# Patient Record
Sex: Female | Born: 1940 | Race: Black or African American | Hispanic: No | Smoking: Never smoker
Health system: Southern US, Community
[De-identification: ages and names within clinical notes are randomized; demographics above are authoritative.]

## PROBLEM LIST (undated history)

## (undated) DIAGNOSIS — J302 Other seasonal allergic rhinitis: Secondary | ICD-10-CM

## (undated) DIAGNOSIS — I10 Essential (primary) hypertension: Secondary | ICD-10-CM

## (undated) DIAGNOSIS — K219 Gastro-esophageal reflux disease without esophagitis: Secondary | ICD-10-CM

---

## 2004-07-17 ENCOUNTER — Encounter: Admission: RE | Admit: 2004-07-17 | Discharge: 2004-07-17 | Payer: Self-pay | Admitting: Internal Medicine

## 2005-11-23 ENCOUNTER — Encounter: Admission: RE | Admit: 2005-11-23 | Discharge: 2005-11-23 | Payer: Self-pay | Admitting: Gastroenterology

## 2005-12-05 ENCOUNTER — Emergency Department (HOSPITAL_COMMUNITY): Admission: EM | Admit: 2005-12-05 | Discharge: 2005-12-05 | Payer: Self-pay | Admitting: Emergency Medicine

## 2006-01-12 ENCOUNTER — Encounter: Admission: RE | Admit: 2006-01-12 | Discharge: 2006-01-12 | Payer: Self-pay | Admitting: Internal Medicine

## 2007-01-16 ENCOUNTER — Encounter: Admission: RE | Admit: 2007-01-16 | Discharge: 2007-01-16 | Payer: Self-pay | Admitting: Internal Medicine

## 2007-10-14 ENCOUNTER — Emergency Department (HOSPITAL_COMMUNITY): Admission: EM | Admit: 2007-10-14 | Discharge: 2007-10-14 | Payer: Self-pay | Admitting: Emergency Medicine

## 2008-08-08 ENCOUNTER — Encounter: Admission: RE | Admit: 2008-08-08 | Discharge: 2008-08-08 | Payer: Self-pay | Admitting: Internal Medicine

## 2009-07-13 ENCOUNTER — Observation Stay (HOSPITAL_COMMUNITY): Admission: EM | Admit: 2009-07-13 | Discharge: 2009-07-14 | Payer: Self-pay | Admitting: Emergency Medicine

## 2009-07-14 ENCOUNTER — Encounter (INDEPENDENT_AMBULATORY_CARE_PROVIDER_SITE_OTHER): Payer: Self-pay | Admitting: Internal Medicine

## 2010-03-21 ENCOUNTER — Observation Stay (HOSPITAL_COMMUNITY): Admission: EM | Admit: 2010-03-21 | Discharge: 2010-03-23 | Payer: Self-pay | Admitting: Emergency Medicine

## 2010-07-11 ENCOUNTER — Encounter: Payer: Self-pay | Admitting: Internal Medicine

## 2010-07-12 ENCOUNTER — Encounter: Payer: Self-pay | Admitting: Internal Medicine

## 2010-09-03 LAB — CBC
HCT: 27.6 % — ABNORMAL LOW (ref 36.0–46.0)
HCT: 30.4 % — ABNORMAL LOW (ref 36.0–46.0)
HCT: 31.7 % — ABNORMAL LOW (ref 36.0–46.0)
Hemoglobin: 11.2 g/dL — ABNORMAL LOW (ref 12.0–15.0)
Hemoglobin: 8.7 g/dL — ABNORMAL LOW (ref 12.0–15.0)
Hemoglobin: 9.1 g/dL — ABNORMAL LOW (ref 12.0–15.0)
Hemoglobin: 9.5 g/dL — ABNORMAL LOW (ref 12.0–15.0)
Hemoglobin: 9.9 g/dL — ABNORMAL LOW (ref 12.0–15.0)
MCH: 25.6 pg — ABNORMAL LOW (ref 26.0–34.0)
MCH: 26.3 pg (ref 26.0–34.0)
MCH: 26.3 pg (ref 26.0–34.0)
MCH: 26.4 pg (ref 26.0–34.0)
MCHC: 31.2 g/dL (ref 30.0–36.0)
MCHC: 31.3 g/dL (ref 30.0–36.0)
MCHC: 31.5 g/dL (ref 30.0–36.0)
MCHC: 31.5 g/dL (ref 30.0–36.0)
MCV: 81.9 fL (ref 78.0–100.0)
MCV: 83.4 fL (ref 78.0–100.0)
MCV: 83.5 fL (ref 78.0–100.0)
Platelets: 174 10*3/uL (ref 150–400)
Platelets: 319 10*3/uL (ref 150–400)
Platelets: 345 10*3/uL (ref 150–400)
Platelets: 347 10*3/uL (ref 150–400)
Platelets: 372 10*3/uL (ref 150–400)
RBC: 3.31 MIL/uL — ABNORMAL LOW (ref 3.87–5.11)
RBC: 3.5 MIL/uL — ABNORMAL LOW (ref 3.87–5.11)
RBC: 3.64 MIL/uL — ABNORMAL LOW (ref 3.87–5.11)
RBC: 3.87 MIL/uL (ref 3.87–5.11)
RBC: 4.24 MIL/uL (ref 3.87–5.11)
RDW: 16.2 % — ABNORMAL HIGH (ref 11.5–15.5)
RDW: 16.2 % — ABNORMAL HIGH (ref 11.5–15.5)
RDW: 16.3 % — ABNORMAL HIGH (ref 11.5–15.5)
WBC: 7.3 10*3/uL (ref 4.0–10.5)
WBC: 7.6 10*3/uL (ref 4.0–10.5)
WBC: 8.2 10*3/uL (ref 4.0–10.5)
WBC: 8.9 10*3/uL (ref 4.0–10.5)

## 2010-09-03 LAB — COMPREHENSIVE METABOLIC PANEL
ALT: 10 U/L (ref 0–35)
AST: 15 U/L (ref 0–37)
Albumin: 3.3 g/dL — ABNORMAL LOW (ref 3.5–5.2)
Alkaline Phosphatase: 72 U/L (ref 39–117)
CO2: 25 mEq/L (ref 19–32)
Calcium: 9.3 mg/dL (ref 8.4–10.5)
Chloride: 112 mEq/L (ref 96–112)
Creatinine, Ser: 0.86 mg/dL (ref 0.4–1.2)
GFR calc Af Amer: >60 mL/min (ref >60)
GFR calc non Af Amer: >60 mL/min (ref >60)
Glucose, Bld: 108 mg/dL — ABNORMAL HIGH (ref 70–99)
Potassium: 3.9 mEq/L (ref 3.5–5.1)
Sodium: 143 mEq/L (ref 135–145)
Total Protein: 6.8 g/dL (ref 6.0–8.3)

## 2010-09-03 LAB — URINALYSIS, ROUTINE W REFLEX MICROSCOPIC
Bilirubin Urine: NEGATIVE
Glucose, UA: NEGATIVE mg/dL
Hgb urine dipstick: NEGATIVE
Ketones, ur: NEGATIVE mg/dL
Nitrite: NEGATIVE
Protein, ur: NEGATIVE mg/dL
Specific Gravity, Urine: 1.022 (ref 1.005–1.030)
Urobilinogen, UA: 0.2 mg/dL (ref 0.0–1.0)
pH: 5.5 (ref 5.0–8.0)

## 2010-09-03 LAB — BASIC METABOLIC PANEL
BUN: 6 mg/dL (ref 6–23)
CO2: 25 mEq/L (ref 19–32)
CO2: 26 mEq/L (ref 19–32)
Calcium: 8.8 mg/dL (ref 8.4–10.5)
Calcium: 8.9 mg/dL (ref 8.4–10.5)
Chloride: 110 mEq/L (ref 96–112)
Creatinine, Ser: 0.81 mg/dL (ref 0.4–1.2)
Creatinine, Ser: 0.88 mg/dL (ref 0.4–1.2)
GFR calc Af Amer: >60 mL/min (ref >60)
GFR calc Af Amer: >60 mL/min (ref >60)
GFR calc non Af Amer: >60 mL/min (ref >60)
Glucose, Bld: 96 mg/dL (ref 70–99)
Potassium: 3.5 mEq/L (ref 3.5–5.1)
Sodium: 141 mEq/L (ref 135–145)
Sodium: 142 mEq/L (ref 135–145)

## 2010-09-03 LAB — IRON AND TIBC
Iron: 41 ug/dL — ABNORMAL LOW (ref 42–135)
UIBC: <55 ug/dL

## 2010-09-03 LAB — FOLATE: Folate: 16 ng/mL

## 2010-09-03 LAB — RETICULOCYTES
RBC.: 3.75 MIL/uL — ABNORMAL LOW (ref 3.87–5.11)
Retic Count, Absolute: 52.5 10*3/uL (ref 19.0–186.0)
Retic Ct Pct: 1.4 % (ref 0.4–3.1)

## 2010-09-03 LAB — URINE CULTURE: Culture  Setup Time: 201110011107

## 2010-09-03 LAB — DIFFERENTIAL
Basophils Absolute: 0 10*3/uL (ref 0.0–0.1)
Basophils Relative: 0 % (ref 0–1)
Eosinophils Absolute: 0.2 10*3/uL (ref 0.0–0.7)
Eosinophils Relative: 3 % (ref 0–5)
Lymphocytes Relative: 34 % (ref 12–46)
Monocytes Absolute: 0.4 10*3/uL (ref 0.1–1.0)
Monocytes Relative: 4 % (ref 3–12)
Neutrophils Relative %: 59 % (ref 43–77)

## 2010-09-03 LAB — PREPARE RBC (CROSSMATCH)

## 2010-09-03 LAB — URINE MICROSCOPIC-ADD ON

## 2010-09-03 LAB — HEMOCCULT GUIAC POC 1CARD (OFFICE): Fecal Occult Bld: POSITIVE

## 2010-09-03 LAB — TYPE AND SCREEN
ABO/RH(D): O POS
Antibody Screen: NEGATIVE

## 2010-09-03 LAB — CEA: CEA: <0.5 ng/mL (ref 0.0–5.0)

## 2010-09-03 LAB — PROTIME-INR
INR: 0.95 (ref 0.00–1.49)
Prothrombin Time: 12.9 seconds (ref 11.6–15.2)

## 2010-09-07 LAB — CARDIAC PANEL(CRET KIN+CKTOT+MB+TROPI)
CK, MB: 0.6 ng/mL (ref 0.3–4.0)
CK, MB: 0.7 ng/mL (ref 0.3–4.0)
Relative Index: INVALID (ref 0.0–2.5)
Troponin I: 0.02 ng/mL (ref 0.00–0.06)
Troponin I: 0.04 ng/mL (ref 0.00–0.06)

## 2010-09-07 LAB — LIPID PANEL
HDL: 45 mg/dL (ref >39)
LDL Cholesterol: 91 mg/dL (ref 0–99)
Triglycerides: 77 mg/dL (ref <150)
VLDL: 15 mg/dL (ref 0–40)

## 2010-09-07 LAB — DIFFERENTIAL
Eosinophils Relative: 4 % (ref 0–5)
Lymphocytes Relative: 43 % (ref 12–46)
Lymphs Abs: 2.6 10*3/uL (ref 0.7–4.0)
Monocytes Relative: 5 % (ref 3–12)

## 2010-09-07 LAB — POCT I-STAT, CHEM 8
BUN: 13 mg/dL (ref 6–23)
Chloride: 112 mEq/L (ref 96–112)
Glucose, Bld: 85 mg/dL (ref 70–99)
HCT: 38 % (ref 36.0–46.0)
Potassium: 3.7 mEq/L (ref 3.5–5.1)

## 2010-09-07 LAB — BASIC METABOLIC PANEL
Chloride: 104 mEq/L (ref 96–112)
Creatinine, Ser: 0.83 mg/dL (ref 0.4–1.2)
GFR calc Af Amer: >60 mL/min (ref >60)
Potassium: 3.4 mEq/L — ABNORMAL LOW (ref 3.5–5.1)
Sodium: 136 mEq/L (ref 135–145)

## 2010-09-07 LAB — CBC
HCT: 36.6 % (ref 36.0–46.0)
HCT: 37 % (ref 36.0–46.0)
Hemoglobin: 12.1 g/dL (ref 12.0–15.0)
Hemoglobin: 12.3 g/dL (ref 12.0–15.0)
MCV: 83.8 fL (ref 78.0–100.0)
Platelets: 249 10*3/uL (ref 150–400)
Platelets: 262 10*3/uL (ref 150–400)
RBC: 4.37 MIL/uL (ref 3.87–5.11)
WBC: 6.1 10*3/uL (ref 4.0–10.5)
WBC: 6.2 10*3/uL (ref 4.0–10.5)

## 2010-09-07 LAB — POCT CARDIAC MARKERS
CKMB, poc: <1 ng/mL — ABNORMAL LOW (ref 1.0–8.0)
Myoglobin, poc: 53.2 ng/mL (ref 12–200)
Troponin i, poc: <0.05 ng/mL (ref 0.00–0.09)

## 2010-09-07 LAB — URINALYSIS, ROUTINE W REFLEX MICROSCOPIC
Glucose, UA: NEGATIVE mg/dL
Nitrite: NEGATIVE
Specific Gravity, Urine: 1.013 (ref 1.005–1.030)
pH: 7 (ref 5.0–8.0)

## 2010-09-07 LAB — URINE CULTURE: Colony Count: NO GROWTH

## 2010-09-07 LAB — PROTIME-INR: Prothrombin Time: 12.4 seconds (ref 11.6–15.2)

## 2014-11-10 ENCOUNTER — Encounter (HOSPITAL_COMMUNITY): Payer: Self-pay | Admitting: *Deleted

## 2014-11-10 ENCOUNTER — Emergency Department (HOSPITAL_COMMUNITY): Payer: Medicare Other

## 2014-11-10 ENCOUNTER — Emergency Department (HOSPITAL_COMMUNITY)
Admission: EM | Admit: 2014-11-10 | Discharge: 2014-11-10 | Disposition: A | Discharge status: Home / Self Care | Payer: Medicare Other | Attending: Emergency Medicine | Admitting: Emergency Medicine

## 2014-11-10 DIAGNOSIS — I10 Essential (primary) hypertension: Secondary | ICD-10-CM | POA: Insufficient documentation

## 2014-11-10 DIAGNOSIS — R079 Chest pain, unspecified: Secondary | ICD-10-CM | POA: Insufficient documentation

## 2014-11-10 DIAGNOSIS — J441 Chronic obstructive pulmonary disease with (acute) exacerbation: Secondary | ICD-10-CM | POA: Diagnosis not present

## 2014-11-10 DIAGNOSIS — J209 Acute bronchitis, unspecified: Secondary | ICD-10-CM

## 2014-11-10 DIAGNOSIS — R05 Cough: Secondary | ICD-10-CM | POA: Diagnosis present

## 2014-11-10 DIAGNOSIS — R509 Fever, unspecified: Secondary | ICD-10-CM | POA: Insufficient documentation

## 2014-11-10 DIAGNOSIS — R059 Cough, unspecified: Secondary | ICD-10-CM

## 2014-11-10 HISTORY — DX: Essential (primary) hypertension: I10

## 2014-11-10 HISTORY — DX: Other seasonal allergic rhinitis: J30.2

## 2014-11-10 LAB — BASIC METABOLIC PANEL
ANION GAP: 8 (ref 5–15)
BUN: 12 mg/dL (ref 6–20)
CALCIUM: 9.3 mg/dL (ref 8.9–10.3)
CO2: 26 mmol/L (ref 22–32)
CREATININE: 0.95 mg/dL (ref 0.44–1.00)
Chloride: 106 mmol/L (ref 101–111)
GFR, EST NON AFRICAN AMERICAN: 57 mL/min — AB (ref >60)
GLUCOSE: 135 mg/dL — AB (ref 65–99)
Potassium: 3.5 mmol/L (ref 3.5–5.1)
SODIUM: 140 mmol/L (ref 135–145)

## 2014-11-10 LAB — CBC
HCT: 36.2 % (ref 36.0–46.0)
Hemoglobin: 11.4 g/dL — ABNORMAL LOW (ref 12.0–15.0)
MCH: 26.6 pg (ref 26.0–34.0)
MCHC: 31.5 g/dL (ref 30.0–36.0)
MCV: 84.4 fL (ref 78.0–100.0)
PLATELETS: 285 10*3/uL (ref 150–400)
RBC: 4.29 MIL/uL (ref 3.87–5.11)
RDW: 16 % — ABNORMAL HIGH (ref 11.5–15.5)
WBC: 8.4 10*3/uL (ref 4.0–10.5)

## 2014-11-10 LAB — I-STAT TROPONIN, ED: TROPONIN I, POC: 0 ng/mL (ref 0.00–0.08)

## 2014-11-10 LAB — BRAIN NATRIURETIC PEPTIDE: B Natriuretic Peptide: 11.8 pg/mL (ref 0.0–100.0)

## 2014-11-10 MED ORDER — IPRATROPIUM-ALBUTEROL 0.5-2.5 (3) MG/3ML IN SOLN
3.0000 mL | Freq: Once | RESPIRATORY_TRACT | Status: AC
  Administered 2014-11-10: 3 mL via RESPIRATORY_TRACT
  Filled 2014-11-10: qty 3

## 2014-11-10 MED ORDER — GUAIFENESIN-DM 100-10 MG/5ML PO SYRP: 5.0000 mL | ORAL_SOLUTION | ORAL | Status: DC | PRN

## 2014-11-10 MED ORDER — PREDNISONE 20 MG PO TABS
60.0000 mg | ORAL_TABLET | Freq: Once | ORAL | Status: AC
  Administered 2014-11-10: 60 mg via ORAL
  Filled 2014-11-10: qty 3

## 2014-11-10 MED ORDER — ALBUTEROL SULFATE HFA 108 (90 BASE) MCG/ACT IN AERS: 1.0000 | INHALATION_SPRAY | Freq: Four times a day (QID) | RESPIRATORY_TRACT | Status: AC | PRN

## 2014-11-10 MED ORDER — GUAIFENESIN ER 600 MG PO TB12: 1200.0000 mg | ORAL_TABLET | Freq: Two times a day (BID) | ORAL | Status: DC

## 2014-11-10 MED ORDER — BENZONATATE 200 MG PO CAPS: 200.0000 mg | ORAL_CAPSULE | Freq: Three times a day (TID) | ORAL | Status: DC | PRN

## 2014-11-10 MED ORDER — PREDNISONE 20 MG PO TABS: 60.0000 mg | ORAL_TABLET | Freq: Every day | ORAL | Status: DC

## 2014-11-10 MED ORDER — BENZONATATE 100 MG PO CAPS
200.0000 mg | ORAL_CAPSULE | Freq: Three times a day (TID) | ORAL | Status: DC | PRN
  Administered 2014-11-10: 200 mg via ORAL
  Filled 2014-11-10: qty 2

## 2014-11-10 NOTE — ED Provider Notes (Signed)
CSN: [id number]     Arrival date & time 11/10/14  0153 History  This chart was scribed for Carol Severinlga Amalia Edgecombe, MD by Abel PrestoKara Demonbreun, ED Scribe. This patient was seen in room B16C/B16C and the patient's care was started at 2:56 AM.    Chief Complaint  Patient presents with  . Cough    The history is provided by the patient. No language interpreter was used.   HPI Comments: Carol HartshornLela M Carlson is a 74 y.o. female with PMHx of HTN who presents to the Emergency Department complaining of recurrent productive cough and congestion with onset 3 months ago, worsening tonight. Pt notes associated subjective fever, chills, and right rib pain with cough. Pt with h/o COPD and asthma. Pt used an inhaler for relief. Pt took Coricidin for relief. Pt has not been seen by a physician since onset. She denies any other complaints at this time.     Past Medical History  Diagnosis Date  . Hypertension   . Seasonal allergies    History reviewed. No pertinent past surgical history. History reviewed. No pertinent family history. History  Substance Use Topics  . Smoking status: Never Smoker   . Smokeless tobacco: Not on file  . Alcohol Use: Not on file   OB History    No data available     Review of Systems  Constitutional: Positive for fever (subjective) and chills.  HENT: Positive for congestion. Negative for sore throat.   Respiratory: Positive for cough.   Musculoskeletal: Positive for myalgias.      Allergies  Review of patient's allergies indicates no known allergies.  Home Medications   Prior to Admission medications   Not on File   BP 138/71 mmHg  Pulse 92  Temp(Src) 98.9 F (37.2 C) (Oral)  Resp 20  Wt 180 lb (81.647 kg)  SpO2 100% Physical Exam  Constitutional: She is oriented to person, place, and time. She appears well-developed and well-nourished.  HENT:  Head: Normocephalic and atraumatic.  Nose: Nose normal.  Mouth/Throat: Oropharynx is clear and moist.  Eyes: Conjunctivae and EOM  are normal. Pupils are equal, round, and reactive to light.  Neck: Normal range of motion. Neck supple. No JVD present. No tracheal deviation present. No thyromegaly present.  Cardiovascular: Normal rate, regular rhythm, normal heart sounds and intact distal pulses.  Exam reveals no gallop and no friction rub.   No murmur heard. Pulmonary/Chest: Effort normal. No stridor. No respiratory distress. She has wheezes. She has no rales. She exhibits no tenderness.  Abdominal: Soft. Bowel sounds are normal. She exhibits no distension and no mass. There is no tenderness. There is no rebound and no guarding.  Musculoskeletal: Normal range of motion. She exhibits no edema or tenderness.  Lymphadenopathy:    She has no cervical adenopathy.  Neurological: She is alert and oriented to person, place, and time. She displays normal reflexes. She exhibits normal muscle tone. Coordination normal.  Skin: Skin is warm and dry. No rash noted. No erythema. No pallor.  Psychiatric: She has a normal mood and affect. Her behavior is normal. Judgment and thought content normal.  Nursing note and vitals reviewed.   ED Course  Procedures (including critical care time) DIAGNOSTIC STUDIES: Oxygen Saturation is 100% on room air, normal by my interpretation.    COORDINATION OF CARE: 3:02 AM Discussed treatment plan with patient at beside, the patient agrees with the plan and has no further questions at this time.   Labs Review Labs Reviewed  CBC -  Abnormal; Notable for the following:    Hemoglobin 11.4 (*)    RDW 16.0 (*)    All other components within normal limits  BASIC METABOLIC PANEL - Abnormal; Notable for the following:    Glucose, Bld 135 (*)    GFR calc non Af Amer 57 (*)    All other components within normal limits  BRAIN NATRIURETIC PEPTIDE  I-STAT TROPOININ, ED    Imaging Review No results found.   EKG Interpretation   Date/Time:  Sunday Nov 10 2014 02:15:56 EDT Ventricular Rate:  87 PR  Interval:  166 QRS Duration: 90 QT Interval:  378 QTC Calculation: 454 R Axis:   -13 Text Interpretation:  Normal sinus rhythm Normal ECG Confirmed by WARD,   DO, KRISTEN ([id number]) on 11/10/2014 2:28:24 AM      MDM   Final diagnoses:  Bronchospasm with bronchitis, acute  Cough    I personally performed the services described in this documentation, which was scribed in my presence. The recorded information has been reviewed and is accurate.  74 year old female, history of COPD who presents with intermittent persistent cough for the last 3 months.  She has not seen her doctor recently.  Has had subjective fevers and chills but not recently.  No improvement with over-the-counter medications taken.  She has been using her inhaler without improvement.  No chest pain.  Tonight, worsening shortness of breath which brought her to the ER    Carol Severin, MD 11/10/14 [phone]

## 2014-11-10 NOTE — Discharge Instructions (Signed)
Cough, Adult  A cough is a reflex that helps clear your throat and airways. It can help heal the body or may be a reaction to an irritated airway. A cough may only last 2 or 3 weeks (acute) or may last more than 8 weeks (chronic).  CAUSES Acute cough:  Viral or bacterial infections. Chronic cough:  Infections.  Allergies.  Asthma.  Post-nasal drip.  Smoking.  Heartburn or acid reflux.  Some medicines.  Chronic lung problems (COPD).  Cancer. SYMPTOMS   Cough.  Fever.  Chest pain.  Increased breathing rate.  High-pitched whistling sound when breathing (wheezing).  Colored mucus that you cough up (sputum). TREATMENT   A bacterial cough may be treated with antibiotic medicine.  A viral cough must run its course and will not respond to antibiotics.  Your caregiver may recommend other treatments if you have a chronic cough. HOME CARE INSTRUCTIONS   Only take over-the-counter or prescription medicines for pain, discomfort, or fever as directed by your caregiver. Use cough suppressants only as directed by your caregiver.  Use a cold steam vaporizer or humidifier in your bedroom or home to help loosen secretions.  Sleep in a semi-upright position if your cough is worse at night.  Rest as needed.  Stop smoking if you smoke. SEEK IMMEDIATE MEDICAL CARE IF:   You have pus in your sputum.  Your cough starts to worsen.  You cannot control your cough with suppressants and are losing sleep.  You begin coughing up blood.  You have difficulty breathing.  You develop pain which is getting worse or is uncontrolled with medicine.  You have a fever. MAKE SURE YOU:   Understand these instructions.  Will watch your condition.  Will get help right away if you are not doing well or get worse. Document Released: 12/04/2010 Document Revised: 08/30/2011 Document Reviewed: 12/04/2010 [facility] Patient Information 2015 [place], [place]. This information is not intended  to replace advice given to you by your health care provider. Make sure you discuss any questions you have with your health care provider.  Cool Mist Vaporizers Vaporizers may help relieve the symptoms of a cough and cold. They add moisture to the air, which helps mucus to become thinner and less sticky. This makes it easier to breathe and cough up secretions. Cool mist vaporizers do not cause serious burns like hot mist vaporizers, which may also be called steamers or humidifiers. Vaporizers have not been proven to help with colds. You should not use a vaporizer if you are allergic to mold. HOME CARE INSTRUCTIONS  Follow the package instructions for the vaporizer.  Do not use anything other than distilled water in the vaporizer.  Do not run the vaporizer all of the time. This can cause mold or bacteria to grow in the vaporizer.  Clean the vaporizer after each time it is used.  Clean and dry the vaporizer well before storing it.  Stop using the vaporizer if worsening respiratory symptoms develop. Document Released: 03/04/2004 Document Revised: 06/12/2013 Document Reviewed: 10/25/2012 [facility] Patient Information 2015 [place], [place]. This information is not intended to replace advice given to you by your health care provider. Make sure you discuss any questions you have with your health care provider.  Acute Bronchitis Bronchitis is inflammation of the airways that extend from the windpipe into the lungs (bronchi). The inflammation often causes mucus to develop. This leads to a cough, which is the most common symptom of bronchitis.  In acute bronchitis, the condition usually develops  suddenly and goes away over time, usually in a couple weeks. Smoking, allergies, and asthma can make bronchitis worse. Repeated episodes of bronchitis may cause further lung problems.  CAUSES Acute bronchitis is most often caused by the same virus that causes a cold. The virus can spread from person to person  (contagious) through coughing, sneezing, and touching contaminated objects. SIGNS AND SYMPTOMS   Cough.   Fever.   Coughing up mucus.   Body aches.   Chest congestion.   Chills.   Shortness of breath.   Sore throat.  DIAGNOSIS  Acute bronchitis is usually diagnosed through a physical exam. Your health care provider will also ask you questions about your medical history. Tests, such as chest X-rays, are sometimes done to rule out other conditions.  TREATMENT  Acute bronchitis usually goes away in a couple weeks. Oftentimes, no medical treatment is necessary. Medicines are sometimes given for relief of fever or cough. Antibiotic medicines are usually not needed but may be prescribed in certain situations. In some cases, an inhaler may be recommended to help reduce shortness of breath and control the cough. A cool mist vaporizer may also be used to help thin bronchial secretions and make it easier to clear the chest.  HOME CARE INSTRUCTIONS  Get plenty of rest.   Drink enough fluids to keep your urine clear or pale yellow (unless you have a medical condition that requires fluid restriction). Increasing fluids may help thin your respiratory secretions (sputum) and reduce chest congestion, and it will prevent dehydration.   Take medicines only as directed by your health care provider.  If you were prescribed an antibiotic medicine, finish it all even if you start to feel better.  Avoid smoking and secondhand smoke. Exposure to cigarette smoke or irritating chemicals will make bronchitis worse. If you are a smoker, consider using nicotine gum or skin patches to help control withdrawal symptoms. Quitting smoking will help your lungs heal faster.   Reduce the chances of another bout of acute bronchitis by washing your hands frequently, avoiding people with cold symptoms, and trying not to touch your hands to your mouth, nose, or eyes.   Keep all follow-up visits as directed by  your health care provider.  SEEK MEDICAL CARE IF: Your symptoms do not improve after 1 week of treatment.  SEEK IMMEDIATE MEDICAL CARE IF:  You develop an increased fever or chills.   You have chest pain.   You have severe shortness of breath.  You have bloody sputum.   You develop dehydration.  You faint or repeatedly feel like you are going to pass out.  You develop repeated vomiting.  You develop a severe headache. MAKE SURE YOU:   Understand these instructions.  Will watch your condition.  Will get help right away if you are not doing well or get worse. Document Released: 07/15/2004 Document Revised: 10/22/2013 Document Reviewed: 11/28/2012 [facility] Patient Information 2015 [place], [place]. This information is not intended to replace advice given to you by your health care provider. Make sure you discuss any questions you have with your health care provider.

## 2014-11-10 NOTE — ED Notes (Signed)
Pt in c/o cough and congestion for the last few months, states the cough will get better for a few weeks then get worse again, in tonight because symptoms were getting worse ago, reports chills, unsure of fever, cough is productive with while sputum, no distress noted

## 2014-12-18 ENCOUNTER — Emergency Department (HOSPITAL_COMMUNITY)
Admission: EM | Admit: 2014-12-18 | Discharge: 2014-12-19 | Disposition: A | Discharge status: Home / Self Care | Payer: Medicare Other | Attending: Emergency Medicine | Admitting: Emergency Medicine

## 2014-12-18 ENCOUNTER — Encounter (HOSPITAL_COMMUNITY): Payer: Self-pay | Admitting: *Deleted

## 2014-12-18 DIAGNOSIS — Y998 Other external cause status: Secondary | ICD-10-CM | POA: Diagnosis not present

## 2014-12-18 DIAGNOSIS — X58XXXA Exposure to other specified factors, initial encounter: Secondary | ICD-10-CM | POA: Diagnosis not present

## 2014-12-18 DIAGNOSIS — I10 Essential (primary) hypertension: Secondary | ICD-10-CM | POA: Insufficient documentation

## 2014-12-18 DIAGNOSIS — Y9389 Activity, other specified: Secondary | ICD-10-CM | POA: Diagnosis not present

## 2014-12-18 DIAGNOSIS — Z79899 Other long term (current) drug therapy: Secondary | ICD-10-CM | POA: Insufficient documentation

## 2014-12-18 DIAGNOSIS — S29012A Strain of muscle and tendon of back wall of thorax, initial encounter: Secondary | ICD-10-CM

## 2014-12-18 DIAGNOSIS — Z7952 Long term (current) use of systemic steroids: Secondary | ICD-10-CM | POA: Insufficient documentation

## 2014-12-18 DIAGNOSIS — S299XXA Unspecified injury of thorax, initial encounter: Secondary | ICD-10-CM | POA: Diagnosis present

## 2014-12-18 DIAGNOSIS — Y9289 Other specified places as the place of occurrence of the external cause: Secondary | ICD-10-CM | POA: Insufficient documentation

## 2014-12-18 MED ORDER — TRAMADOL HCL 50 MG PO TABS
50.0000 mg | ORAL_TABLET | Freq: Once | ORAL | Status: AC
  Administered 2014-12-19: 50 mg via ORAL
  Filled 2014-12-18: qty 1

## 2014-12-18 NOTE — ED Notes (Signed)
Wentz, MD at bedside.  

## 2014-12-18 NOTE — ED Notes (Signed)
The pt is c/o neck pain and bi-lateral shoulder pain since this am. With some thoracic spine pain since this am. No n v or diarrhea

## 2014-12-18 NOTE — ED Provider Notes (Signed)
CSN: [id number]     Arrival date & time 12/18/14  1926 History   First MD Initiated Contact with Patient 12/18/14 2305     Chief Complaint  Patient presents with  . Back Pain     (Consider location/radiation/quality/duration/timing/severity/associated sxs/prior Treatment) HPI   Carol Carlson is a 74 y.o. female percents for evaluation of upper back pain starting this morning, after lifting heavy boxes 2 days ago. [facility] without relief. Pain radiates to her neck centrally posterior. No radicular symptoms in the arms. Carol Carlson is able to walk without problems. No other known trauma. No preceding or prior symptoms. There are no other known modifying factors.   Past Medical History  Diagnosis Date  . Hypertension   . Seasonal allergies    History reviewed. No pertinent past surgical history. No family history on file. History  Substance Use Topics  . Smoking status: Never Smoker   . Smokeless tobacco: Not on file  . Alcohol Use: No   OB History    No data available     Review of Systems  All other systems reviewed and are negative.     Allergies  Review of patient's allergies indicates no known allergies.  Home Medications   Prior to Admission medications   Medication Sig Start Date End Date Taking? Authorizing Provider  albuterol (PROVENTIL HFA;VENTOLIN HFA) 108 (90 BASE) MCG/ACT inhaler Inhale 1-2 puffs into the lungs every 6 (six) hours as needed for wheezing or shortness of breath. 11/10/14   Marisa Severinlga Otter, MD  benzonatate (TESSALON) 200 MG capsule Take 1 capsule (200 mg total) by mouth 3 (three) times daily as needed for cough. 11/10/14   Marisa Severinlga Otter, MD  guaiFENesin (MUCINEX) 600 MG 12 hr tablet Take 2 tablets (1,200 mg total) by mouth 2 (two) times daily. 11/10/14   Marisa Severinlga Otter, MD  guaiFENesin-dextromethorphan (ROBITUSSIN DM) 100-10 MG/5ML syrup Take 5 mLs by mouth every 4 (four) hours as needed for cough. 11/10/14   Marisa Severinlga Otter, MD  predniSONE (DELTASONE) 20 MG  tablet Take 3 tablets (60 mg total) by mouth daily. 11/10/14   Marisa Severinlga Otter, MD   BP 165/67 mmHg  Pulse 70  Temp(Src) 98.2 F (36.8 C)  Resp 20  Ht 5' 3.5" (1.613 m)  Wt 174 lb 2 oz (78.983 kg)  BMI 30.36 kg/m2  SpO2 95% Physical Exam  Constitutional: Carol Carlson is oriented to person, place, and time. Carol Carlson appears well-developed and well-nourished. No distress.  HENT:  Head: Normocephalic and atraumatic.  Right Ear: External ear normal.  Left Ear: External ear normal.  Eyes: Conjunctivae and EOM are normal. Pupils are equal, round, and reactive to light.  Neck: Normal range of motion and phonation normal. Neck supple.  Cardiovascular: Normal rate, regular rhythm and normal heart sounds.   Pulmonary/Chest: Effort normal and breath sounds normal. No respiratory distress. Carol Carlson has no wheezes. Carol Carlson exhibits no tenderness and no bony tenderness.  Musculoskeletal: Normal range of motion.  Tender bilateral trapezius muscles, and bilateral paravertebral cervical musculature. Normal range of motion, neck and upper back.  Neurological: Carol Carlson is alert and oriented to person, place, and time. No cranial nerve deficit or sensory deficit. Carol Carlson exhibits normal muscle tone. Coordination normal.  Skin: Skin is warm, dry and intact.  Psychiatric: Carol Carlson has a normal mood and affect. Her behavior is normal. Judgment and thought content normal.  Nursing note and vitals reviewed.   ED Course  Procedures (including critical care time)  Medications  traMADol (ULTRAM)  tablet 50 mg (not administered)    Patient Vitals for the past 24 hrs:  BP Temp Pulse Resp SpO2 Height Weight  12/18/14 2330 147/71 mmHg - 71 - 99 % - -  12/18/14 2300 136/78 mmHg - 73 - 95 % - -  12/18/14 2232 165/67 mmHg - 70 20 95 % - -  12/18/14 2230 165/67 mmHg - 71 - 97 % - -  12/18/14 2200 154/63 mmHg - 69 - 98 % - -  12/18/14 2145 166/65 mmHg - 74 - 95 % - -  12/18/14 2140 167/70 mmHg - 74 20 97 % - -  12/18/14 1933 163/69 mmHg 98.2 F (36.8  C) 77 (!) 8 96 % 5' 3.5" (1.613 m) 174 lb 2 oz (78.983 kg)    Findings discussed with patient, all questions answered   Labs Review Labs Reviewed - No data to display  Imaging Review No results found.   EKG Interpretation None      MDM   Final diagnoses:  Muscle strain of left upper back, initial encounter  Muscle strain of right upper back, initial encounter    Evaluation is consistent with musculoskeletal strain of upper back. 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 No radicular symptoms or evidence for spinal myelopathy.   Nursing Notes Reviewed/ Care Coordinated Applicable Imaging Reviewed Interpretation of Laboratory Data incorporated into ED treatment  The patient appears reasonably screened and/or stabilized for discharge and I doubt any other medical condition or other [facility] requiring further screening, evaluation, or treatment in the ED at this time prior to discharge.  Plan: Home Medications- Tylenol or tramadol; Home Treatments- rest, heat; return here if the recommended treatment, does not improve the symptoms; Recommended follow up- PCP checkup in 5-7 days.     Mancel Bale, MD 12/19/14 [phone]

## 2014-12-18 NOTE — ED Notes (Signed)
The pt reports that she has not fallen but she has been lifting plastic containers in her room that are filled with clothes and sheets.  Her pain is worse with movement.

## 2014-12-19 DIAGNOSIS — S29012A Strain of muscle and tendon of back wall of thorax, initial encounter: Secondary | ICD-10-CM | POA: Diagnosis not present

## 2014-12-19 MED ORDER — TRAMADOL-ACETAMINOPHEN 37.5-325 MG PO TABS: 1.0000 | ORAL_TABLET | Freq: Four times a day (QID) | ORAL | Status: DC | PRN

## 2014-12-19 NOTE — Discharge Instructions (Signed)
Back Pain, Adult Low back pain is very common. About 1 in 5 people have back pain.The cause of low back pain is rarely dangerous. The pain often gets better over time.About half of people with a sudden onset of back pain feel better in just 2 weeks. About 8 in 10 people feel better by 6 weeks.  CAUSES Some common causes of back pain include:  Strain of the muscles or ligaments supporting the spine.  Wear and tear (degeneration) of the spinal discs.  Arthritis.  Direct injury to the back. DIAGNOSIS Most of the time, the direct cause of low back pain is not known.However, back pain can be treated effectively even when the exact cause of the pain is unknown.Answering your caregiver's questions about your overall health and symptoms is one of the most accurate ways to make sure the cause of your pain is not dangerous. If your caregiver needs more information, he or she may order lab work or imaging tests (X-rays or MRIs).However, even if imaging tests show changes in your back, this usually does not require surgery. HOME CARE INSTRUCTIONS For many people, back pain returns.Since low back pain is rarely dangerous, it is often a condition that people can learn to manageon their own.   Remain active. It is stressful on the back to sit or stand in one place. Do not sit, drive, or stand in one place for more than 30 minutes at a time. Take short walks on level surfaces as soon as pain allows.Try to increase the length of time you walk each day.  Do not stay in bed.Resting more than 1 or 2 days can delay your recovery.  Do not avoid exercise or work.Your body is made to move.It is not dangerous to be active, even though your back may hurt.Your back will likely heal faster if you return to being active before your pain is gone.  Pay attention to your body when you bend and lift. Many people have less discomfortwhen lifting if they bend their knees, keep the load close to their bodies,and  avoid twisting. Often, the most comfortable positions are those that put less stress on your recovering back.  Find a comfortable position to sleep. Use a firm mattress and lie on your side with your knees slightly bent. If you lie on your back, put a pillow under your knees.  Only take over-the-counter or prescription medicines as directed by your caregiver. Over-the-counter medicines to reduce pain and inflammation are often the most helpful.Your caregiver may prescribe muscle relaxant drugs.These medicines help dull your pain so you can more quickly return to your normal activities and healthy exercise.  Put ice on the injured area.  Put ice in a plastic bag.  Place a towel between your skin and the bag.  Leave the ice on for 15-20 minutes, 03-04 times a day for the first 2 to 3 days. After that, ice and heat may be alternated to reduce pain and spasms.  Ask your caregiver about trying back exercises and gentle massage. This may be of some benefit.  Avoid feeling anxious or stressed.Stress increases muscle tension and can worsen back pain.It is important to recognize when you are anxious or stressed and learn ways to manage it.Exercise is a great option. SEEK MEDICAL CARE IF:  You have pain that is not relieved with rest or medicine.  You have pain that does not improve in 1 week.  You have new symptoms.  You are generally not feeling well. SEEK   IMMEDIATE MEDICAL CARE IF:   You have pain that radiates from your back into your legs.  You develop new bowel or bladder control problems.  You have unusual weakness or numbness in your arms or legs.  You develop nausea or vomiting.  You develop abdominal pain.  You feel faint. Document Released: 06/07/2005 Document Revised: 12/07/2011 Document Reviewed: 10/09/2013 [employer] Patient Information 2015 [employer]. This information is not intended to replace advice given to you by your health care provider. Make sure you  discuss any questions you have with your health care provider.  

## 2015-02-28 ENCOUNTER — Other Ambulatory Visit: Payer: Self-pay | Admitting: Internal Medicine

## 2015-02-28 DIAGNOSIS — E2839 Other primary ovarian failure: Secondary | ICD-10-CM

## 2015-08-09 ENCOUNTER — Encounter (HOSPITAL_COMMUNITY): Payer: Self-pay

## 2015-08-09 DIAGNOSIS — Z7951 Long term (current) use of inhaled steroids: Secondary | ICD-10-CM

## 2015-08-09 DIAGNOSIS — K810 Acute cholecystitis: Secondary | ICD-10-CM | POA: Diagnosis not present

## 2015-08-09 DIAGNOSIS — K219 Gastro-esophageal reflux disease without esophagitis: Secondary | ICD-10-CM | POA: Diagnosis present

## 2015-08-09 DIAGNOSIS — Z79899 Other long term (current) drug therapy: Secondary | ICD-10-CM

## 2015-08-09 DIAGNOSIS — Z7982 Long term (current) use of aspirin: Secondary | ICD-10-CM

## 2015-08-09 DIAGNOSIS — I10 Essential (primary) hypertension: Secondary | ICD-10-CM | POA: Diagnosis present

## 2015-08-09 DIAGNOSIS — K8000 Calculus of gallbladder with acute cholecystitis without obstruction: Principal | ICD-10-CM | POA: Diagnosis present

## 2015-08-09 NOTE — ED Notes (Signed)
Pt here for vomiting, constipation and recent hernia diagnosis and abd pain. Onset today, sts feels like her stomach is swelling.

## 2015-08-09 NOTE — ED Notes (Signed)
Also reports neck pain

## 2015-08-10 ENCOUNTER — Inpatient Hospital Stay (HOSPITAL_COMMUNITY)
Admission: EM | Admit: 2015-08-10 | Discharge: 2015-08-12 | DRG: 419 | Disposition: A | Discharge status: Home / Self Care | Payer: Medicare Other | Attending: General Surgery | Admitting: General Surgery

## 2015-08-10 ENCOUNTER — Encounter (HOSPITAL_COMMUNITY): Payer: Self-pay | Admitting: *Deleted

## 2015-08-10 ENCOUNTER — Emergency Department (HOSPITAL_COMMUNITY): Payer: Medicare Other

## 2015-08-10 DIAGNOSIS — I10 Essential (primary) hypertension: Secondary | ICD-10-CM | POA: Diagnosis present

## 2015-08-10 DIAGNOSIS — K219 Gastro-esophageal reflux disease without esophagitis: Secondary | ICD-10-CM | POA: Diagnosis present

## 2015-08-10 DIAGNOSIS — Z7951 Long term (current) use of inhaled steroids: Secondary | ICD-10-CM | POA: Diagnosis not present

## 2015-08-10 DIAGNOSIS — K810 Acute cholecystitis: Secondary | ICD-10-CM | POA: Diagnosis present

## 2015-08-10 DIAGNOSIS — K8000 Calculus of gallbladder with acute cholecystitis without obstruction: Secondary | ICD-10-CM | POA: Diagnosis present

## 2015-08-10 DIAGNOSIS — Z79899 Other long term (current) drug therapy: Secondary | ICD-10-CM | POA: Diagnosis not present

## 2015-08-10 DIAGNOSIS — Z7982 Long term (current) use of aspirin: Secondary | ICD-10-CM | POA: Diagnosis not present

## 2015-08-10 HISTORY — DX: Essential (primary) hypertension: I10

## 2015-08-10 HISTORY — DX: Gastro-esophageal reflux disease without esophagitis: K21.9

## 2015-08-10 LAB — CBC
HEMATOCRIT: 37.1 % (ref 36.0–46.0)
Hemoglobin: 11.6 g/dL — ABNORMAL LOW (ref 12.0–15.0)
MCH: 26.2 pg (ref 26.0–34.0)
MCHC: 31.3 g/dL (ref 30.0–36.0)
MCV: 83.9 fL (ref 78.0–100.0)
PLATELETS: 266 10*3/uL (ref 150–400)
RBC: 4.42 MIL/uL (ref 3.87–5.11)
RDW: 15.8 % — AB (ref 11.5–15.5)
WBC: 10.6 10*3/uL — ABNORMAL HIGH (ref 4.0–10.5)

## 2015-08-10 LAB — URINALYSIS, ROUTINE W REFLEX MICROSCOPIC
Bilirubin Urine: NEGATIVE
GLUCOSE, UA: 100 mg/dL — AB
HGB URINE DIPSTICK: NEGATIVE
KETONES UR: NEGATIVE mg/dL
LEUKOCYTES UA: NEGATIVE
Nitrite: NEGATIVE
PH: 7 (ref 5.0–8.0)
Protein, ur: NEGATIVE mg/dL
Specific Gravity, Urine: 1.018 (ref 1.005–1.030)

## 2015-08-10 LAB — COMPREHENSIVE METABOLIC PANEL
ALBUMIN: 3.7 g/dL (ref 3.5–5.0)
ALT: 107 U/L — AB (ref 14–54)
AST: 212 U/L — AB (ref 15–41)
Alkaline Phosphatase: 91 U/L (ref 38–126)
Anion gap: 10 (ref 5–15)
BUN: 11 mg/dL (ref 6–20)
CHLORIDE: 108 mmol/L (ref 101–111)
CO2: 23 mmol/L (ref 22–32)
CREATININE: 0.86 mg/dL (ref 0.44–1.00)
Calcium: 9.6 mg/dL (ref 8.9–10.3)
GFR calc Af Amer: >60 mL/min (ref >60)
GFR calc non Af Amer: >60 mL/min (ref >60)
GLUCOSE: 160 mg/dL — AB (ref 65–99)
POTASSIUM: 3.7 mmol/L (ref 3.5–5.1)
Sodium: 141 mmol/L (ref 135–145)
Total Bilirubin: 0.7 mg/dL (ref 0.3–1.2)
Total Protein: 7.1 g/dL (ref 6.5–8.1)

## 2015-08-10 LAB — TROPONIN I: Troponin I: <0.03 ng/mL (ref <0.031)

## 2015-08-10 LAB — SURGICAL PCR SCREEN
MRSA, PCR: NEGATIVE
Staphylococcus aureus: NEGATIVE

## 2015-08-10 LAB — LIPASE, BLOOD: LIPASE: 33 U/L (ref 11–51)

## 2015-08-10 MED ORDER — HYDROCODONE-ACETAMINOPHEN 5-325 MG PO TABS
1.0000 | ORAL_TABLET | ORAL | Status: DC | PRN
  Administered 2015-08-11: 2 via ORAL
  Filled 2015-08-10: qty 2

## 2015-08-10 MED ORDER — MORPHINE SULFATE (PF) 4 MG/ML IV SOLN
4.0000 mg | Freq: Once | INTRAVENOUS | Status: DC
  Filled 2015-08-10: qty 1

## 2015-08-10 MED ORDER — ONDANSETRON HCL 4 MG/2ML IJ SOLN
4.0000 mg | Freq: Four times a day (QID) | INTRAMUSCULAR | Status: DC | PRN
  Administered 2015-08-11: 4 mg via INTRAVENOUS
  Filled 2015-08-10: qty 2

## 2015-08-10 MED ORDER — ACETAMINOPHEN 500 MG PO TABS
1000.0000 mg | ORAL_TABLET | Freq: Four times a day (QID) | ORAL | Status: DC
  Administered 2015-08-10 – 2015-08-12 (×6): 1000 mg via ORAL
  Filled 2015-08-10 (×6): qty 2

## 2015-08-10 MED ORDER — DEXTROSE 5 % IV SOLN
2.0000 g | Freq: Once | INTRAVENOUS | Status: AC
  Administered 2015-08-10: 2 g via INTRAVENOUS
  Filled 2015-08-10: qty 2

## 2015-08-10 MED ORDER — HYDROMORPHONE HCL 1 MG/ML IJ SOLN
0.5000 mg | INTRAMUSCULAR | Status: DC | PRN
  Administered 2015-08-11: 1 mg via INTRAVENOUS
  Filled 2015-08-10: qty 1

## 2015-08-10 MED ORDER — ONDANSETRON 4 MG PO TBDP: 4.0000 mg | ORAL_TABLET | Freq: Four times a day (QID) | ORAL | Status: DC | PRN

## 2015-08-10 MED ORDER — ENOXAPARIN SODIUM 40 MG/0.4ML ~~LOC~~ SOLN
40.0000 mg | SUBCUTANEOUS | Status: DC
  Administered 2015-08-12: 40 mg via SUBCUTANEOUS
  Filled 2015-08-10: qty 0.4

## 2015-08-10 MED ORDER — DEXTROSE 5 % IV SOLN
2.0000 g | INTRAVENOUS | Status: DC
  Administered 2015-08-11 – 2015-08-12 (×2): 2 g via INTRAVENOUS
  Filled 2015-08-10 (×2): qty 2

## 2015-08-10 MED ORDER — PANTOPRAZOLE SODIUM 40 MG IV SOLR
80.0000 mg | Freq: Once | INTRAVENOUS | Status: AC
  Administered 2015-08-10: 80 mg via INTRAVENOUS
  Filled 2015-08-10: qty 80

## 2015-08-10 MED ORDER — SODIUM CHLORIDE 0.9 % IV BOLUS (SEPSIS)
1000.0000 mL | Freq: Once | INTRAVENOUS | Status: AC
  Administered 2015-08-10: 1000 mL via INTRAVENOUS

## 2015-08-10 MED ORDER — DOCUSATE SODIUM 100 MG PO CAPS
100.0000 mg | ORAL_CAPSULE | Freq: Two times a day (BID) | ORAL | Status: DC
  Administered 2015-08-10 – 2015-08-12 (×3): 100 mg via ORAL
  Filled 2015-08-10 (×3): qty 1

## 2015-08-10 MED ORDER — SODIUM CHLORIDE 0.9 % IV SOLN
INTRAVENOUS | Status: DC
  Administered 2015-08-10 – 2015-08-11 (×4): via INTRAVENOUS

## 2015-08-10 MED ORDER — ONDANSETRON HCL 4 MG/2ML IJ SOLN
4.0000 mg | Freq: Once | INTRAMUSCULAR | Status: AC
  Administered 2015-08-10: 4 mg via INTRAVENOUS
  Filled 2015-08-10 (×2): qty 2

## 2015-08-10 MED ORDER — PANTOPRAZOLE SODIUM 40 MG IV SOLR
40.0000 mg | Freq: Every day | INTRAVENOUS | Status: DC
  Administered 2015-08-10 – 2015-08-11 (×2): 40 mg via INTRAVENOUS
  Filled 2015-08-10 (×2): qty 40

## 2015-08-10 NOTE — ED Notes (Signed)
RN attempted x 2 for IV start; 2nd RN to start IV  

## 2015-08-10 NOTE — H&P (Signed)
History   Carol Carlson is an 75 y.o. female.   Chief Complaint: Abdominal pain since 9pm on 08/09/15, with constipation and N/V Chief Complaint  Patient presents with  . Abdominal Pain  . Emesis    Abdominal Pain Pain location:  Epigastric and R flank Pain quality: bloating   Pain radiates to:  R flank Pain severity:  Moderate Duration:  12 hours Timing:  Intermittent Progression:  Waxing and waning Chronicity:  New Worsened by:  Palpation Associated symptoms: constipation, cough, nausea and vomiting   Associated symptoms: no chest pain, no dysuria, no fever and no shortness of breath   Risk factors: being elderly     Past Medical History  Diagnosis Date  . Hypertension   . Seasonal allergies     History reviewed. No pertinent past surgical history.  History reviewed. No pertinent family history. Social History:  reports that she has never smoked. She does not have any smokeless tobacco history on file. She reports that she does not drink alcohol. Her drug history is not on file.  Allergies   Allergies  Allergen Reactions  . Aspirin Other (See Comments)    Can only take 96m  3214mmakes heart race    Home Medications   (Not in a hospital admission)     Results for orders placed or performed during the hospital encounter of 08/10/15 (from the past 48 hour(s))  Lipase, blood     Status: None   Collection Time: 08/09/15 11:56 PM  Result Value Ref Range   Lipase 33 11 - 51 U/L  Comprehensive metabolic panel     Status: Abnormal   Collection Time: 08/09/15 11:56 PM  Result Value Ref Range   Sodium 141 135 - 145 mmol/L   Potassium 3.7 3.5 - 5.1 mmol/L   Chloride 108 101 - 111 mmol/L   CO2 23 22 - 32 mmol/L   Glucose, Bld 160 (H) 65 - 99 mg/dL   BUN 11 6 - 20 mg/dL   Creatinine, Ser 0.86 0.44 - 1.00 mg/dL   Calcium 9.6 8.9 - 10.3 mg/dL   Total Protein 7.1 6.5 - 8.1 g/dL   Albumin 3.7 3.5 - 5.0 g/dL   AST 212 (H) 15 - 41 U/L   ALT 107 (H) 14 - 54 U/L   Alkaline Phosphatase 91 38 - 126 U/L   Total Bilirubin 0.7 0.3 - 1.2 mg/dL   GFR calc non Af Amer >60 >60 mL/min   GFR calc Af Amer >60 >60 mL/min    Comment: (NOTE) The eGFR has been calculated using the CKD EPI equation. This calculation has not been validated in all clinical situations. eGFR's persistently <60 mL/min signify possible Chronic Kidney Disease.    Anion gap 10 5 - 15  CBC     Status: Abnormal   Collection Time: 08/09/15 11:56 PM  Result Value Ref Range   WBC 10.6 (H) 4.0 - 10.5 K/uL   RBC 4.42 3.87 - 5.11 MIL/uL   Hemoglobin 11.6 (L) 12.0 - 15.0 g/dL   HCT 37.1 36.0 - 46.0 %   MCV 83.9 78.0 - 100.0 fL   MCH 26.2 26.0 - 34.0 pg   MCHC 31.3 30.0 - 36.0 g/dL   RDW 15.8 (H) 11.5 - 15.5 %   Platelets 266 150 - 400 K/uL  Urinalysis, Routine w reflex microscopic [facility]    Status: Abnormal   Collection Time: 08/09/15 11:57 PM  Result Value Ref Range   Color,  Urine YELLOW YELLOW   APPearance HAZY (A) CLEAR   Specific Gravity, Urine 1.018 1.005 - 1.030   pH 7.0 5.0 - 8.0   Glucose, UA 100 (A) NEGATIVE mg/dL   Hgb urine dipstick NEGATIVE NEGATIVE   Bilirubin Urine NEGATIVE NEGATIVE   Ketones, ur NEGATIVE NEGATIVE mg/dL   Protein, ur NEGATIVE NEGATIVE mg/dL   Nitrite NEGATIVE NEGATIVE   Leukocytes, UA NEGATIVE NEGATIVE    Comment: MICROSCOPIC NOT DONE ON URINES WITH NEGATIVE PROTEIN, BLOOD, LEUKOCYTES, NITRITE, OR GLUCOSE <1000 mg/dL.  Troponin I     Status: None   Collection Time: 08/10/15  5:15 AM  Result Value Ref Range   Troponin I <0.03 <0.031 ng/mL    Comment:        NO INDICATION OF MYOCARDIAL INJURY.    US Abdomen Limited  08/10/2015  CLINICAL DATA:  Nausea, vomiting, and abdominal pain for 1 day. EXAM: US ABDOMEN LIMITED - RIGHT UPPER QUADRANT COMPARISON:  CT 03/21/2010 FINDINGS: Gallbladder: Stones in the gallbladder, largest measuring 1.4 cm diameter. Small amount of layering sludge. No gallbladder wall thickening but Murphy's sign is positive.  Common bile duct: Diameter: 2.8 mm, normal Liver: No focal lesion identified. Within normal limits in parenchymal echogenicity. IMPRESSION: Cholelithiasis with positive Murphy's sign. These changes are associated with acute cholecystitis in the appropriate clinical setting. Electronically Signed   By: Lucienne Capers M.D.   On: 08/10/2015 06:00   Review of Systems  Constitutional: Negative for fever.  Respiratory: Positive for cough. Negative for shortness of breath.   Cardiovascular: Negative for chest pain.  Gastrointestinal: Positive for nausea, vomiting, abdominal pain and constipation.  Genitourinary: Negative for dysuria.    Blood pressure 155/66, pulse 77, temperature 97.6 F (36.4 C), temperature source Oral, resp. rate 16, height _0  (1.6 m), weight 77.111 kg (170 lb), SpO2 100 %. Physical Exam  Constitutional: She is oriented to person, place, and time. She appears well-developed.  HENT:  Head: Normocephalic and atraumatic.  Eyes: EOM are normal.  Cardiovascular: Normal rate and regular rhythm.   Respiratory:  CTAB  GI: Soft. There is tenderness in the epigastric area. There is positive Murphy's sign.  Neurological: She is alert and oriented to person, place, and time.     Assessment/Plan Acute Cholecystitis - Positive [place] for gall stones, positive murphy's sign; Cholecystectomy today HTN - Continue Lotrel FEN - NPO; Continue pain control PRN, ABX, and NS Fluids VTE - SCDs; Lovenox  Lehman Prom 08/10/2015, 7:44 AM   Procedures

## 2015-08-10 NOTE — ED Notes (Signed)
MD at bedside. 

## 2015-08-10 NOTE — ED Notes (Signed)
Patient transported to Ultrasound 

## 2015-08-10 NOTE — ED Notes (Signed)
IV team at bedside 

## 2015-08-10 NOTE — ED Provider Notes (Signed)
CSN: [id number]     Arrival date & time 08/09/15  2319 History   First MD Initiated Contact with Patient 08/10/15 0434     Chief Complaint  Patient presents with  . Abdominal Pain  . Emesis     (Consider location/radiation/quality/duration/timing/severity/associated sxs/prior Treatment) HPI Comments: Pt with hx of HTN comes in with cc of abd pain. Pt reports that her symptoms started around 10 pm. She has epigastric pain. Pain is described as sharp pain. She has had some nausea and emesis. Pt denies diarrhea. No hx of similar pain. Pain is non radiating.  Pt also c/o some chest discomfort, chest discomfort is L sided pain that started about the same time. The pain is non radiating. There is no associated dib, diophoresis, nausea, dizziness. Pain is non radiating. She has no CAD.   ROS 10 Systems reviewed and are negative for acute change except as noted in the HPI.     Patient is a 75 y.o. female presenting with abdominal pain and vomiting. The history is provided by the patient.  Abdominal Pain Associated symptoms: vomiting   Emesis Associated symptoms: abdominal pain     Past Medical History  Diagnosis Date  . Hypertension   . Seasonal allergies    History reviewed. No pertinent past surgical history. History reviewed. No pertinent family history. Social History  Substance Use Topics  . Smoking status: Never Smoker   . Smokeless tobacco: None  . Alcohol Use: No   OB History    No data available     Review of Systems  Gastrointestinal: Positive for vomiting and abdominal pain.      Allergies  Aspirin  Home Medications   Prior to Admission medications   Medication Sig Start Date End Date Taking? Authorizing Provider  albuterol (PROVENTIL HFA;VENTOLIN HFA) 108 (90 BASE) MCG/ACT inhaler Inhale 1-2 puffs into the lungs every 6 (six) hours as needed for wheezing or shortness of breath. 11/10/14  Yes Marisa Severin, MD  amLODipine-benazepril (LOTREL) 10-40 MG capsule  Take 1 capsule by mouth daily.   Yes Historical Provider, MD  Aspirin-Salicylamide-Caffeine (BC HEADACHE POWDER PO) Take 0.5 Packages by mouth daily as needed (pain).   Yes Historical Provider, MD  cetirizine (ZYRTEC) 10 MG tablet Take 10 mg by mouth daily as needed for allergies.   Yes Historical Provider, MD  esomeprazole (NEXIUM) 20 MG capsule Take 20 mg by mouth daily as needed (acid reflux).   Yes Historical Provider, MD   BP 155/66 mmHg  Pulse 77  Temp(Src) 97.6 F (36.4 C) (Oral)  Resp 16  Ht  (1.6 m)  Wt 170 lb (77.111 kg)  BMI 30.12 kg/m2  SpO2 100% Physical Exam  Constitutional: She is oriented to person, place, and time. She appears well-developed.  HENT:  Head: Normocephalic and atraumatic.  Eyes: Conjunctivae and EOM are normal. Pupils are equal, round, and reactive to light.  Neck: Normal range of motion. Neck supple.  Cardiovascular: Normal rate, regular rhythm, normal heart sounds and intact distal pulses.   Pulmonary/Chest: Effort normal and breath sounds normal. No respiratory distress.  Abdominal: Soft. Bowel sounds are normal. She exhibits no distension. There is tenderness. There is guarding.  Epigastric and RUQ tenderness with guarding  Neurological: She is alert and oriented to person, place, and time.  Skin: Skin is warm and dry.  Nursing note and vitals reviewed.   ED Course  Procedures (including critical care time) Labs Review Labs Reviewed  COMPREHENSIVE METABOLIC PANEL - Abnormal; Notable  for the following:    Glucose, Bld 160 (*)    AST 212 (*)    ALT 107 (*)    All other components within normal limits  CBC - Abnormal; Notable for the following:    WBC 10.6 (*)    Hemoglobin 11.6 (*)    RDW 15.8 (*)    All other components within normal limits  URINALYSIS, ROUTINE W REFLEX MICROSCOPIC (NOT AT [facility]) - Abnormal; Notable for the following:    APPearance HAZY (*)    Glucose, UA 100 (*)    All other components within normal limits  LIPASE,  BLOOD  TROPONIN I    Imaging Review US Abdomen Limited  08/10/2015  CLINICAL DATA:  Nausea, vomiting, and abdominal pain for 1 day. EXAM: US ABDOMEN LIMITED - RIGHT UPPER QUADRANT COMPARISON:  CT 03/21/2010 FINDINGS: Gallbladder: Stones in the gallbladder, largest measuring 1.4 cm diameter. Small amount of layering sludge. No gallbladder wall thickening but Murphy's sign is positive. Common bile duct: Diameter: 2.8 mm, normal Liver: No focal lesion identified. Within normal limits in parenchymal echogenicity. IMPRESSION: Cholelithiasis with positive Murphy's sign. These changes are associated with acute cholecystitis in the appropriate clinical setting. Electronically Signed   By: Burman Nieves M.D.   On: 08/10/2015 06:00   I have personally reviewed and evaluated these images and lab results as part of my medical decision-making.   EKG Interpretation None      MDM   Final diagnoses:  Acute cholecystitis    Pt comes in with cc of abd pain.  DDx includes:  Pancreatitis Hepatobiliary pathology including cholecystitis Gastritis/PUD SBO ACS syndrome  [place] abd ordered. Clinical concerns are for pancreatitis and cholecystitis.  Chest pain free now - but she had pain at some point, L sided, so we will get trop.  Late entry: Patient has cholecystitis clinically. Dr. Lindie Spruce to see the patient.   Derwood Kaplan, MD 08/10/15 [phone]

## 2015-08-11 ENCOUNTER — Inpatient Hospital Stay (HOSPITAL_COMMUNITY): Payer: Medicare Other | Admitting: Certified Registered Nurse Anesthetist

## 2015-08-11 ENCOUNTER — Encounter (HOSPITAL_COMMUNITY): Admission: EM | Disposition: A | Discharge status: Home / Self Care | Payer: Self-pay | Source: Home / Self Care

## 2015-08-11 HISTORY — PX: CHOLECYSTECTOMY: SHX55

## 2015-08-11 LAB — COMPREHENSIVE METABOLIC PANEL
ALBUMIN: 3 g/dL — AB (ref 3.5–5.0)
ALK PHOS: 113 U/L (ref 38–126)
ALT: 283 U/L — ABNORMAL HIGH (ref 14–54)
AST: 229 U/L — AB (ref 15–41)
Anion gap: 10 (ref 5–15)
BILIRUBIN TOTAL: 0.8 mg/dL (ref 0.3–1.2)
BUN: 8 mg/dL (ref 6–20)
CALCIUM: 8.9 mg/dL (ref 8.9–10.3)
CO2: 22 mmol/L (ref 22–32)
CREATININE: 1.01 mg/dL — AB (ref 0.44–1.00)
Chloride: 111 mmol/L (ref 101–111)
GFR calc Af Amer: >60 mL/min (ref >60)
GFR, EST NON AFRICAN AMERICAN: 53 mL/min — AB (ref >60)
GLUCOSE: 86 mg/dL (ref 65–99)
POTASSIUM: 4.1 mmol/L (ref 3.5–5.1)
Sodium: 143 mmol/L (ref 135–145)
TOTAL PROTEIN: 6.4 g/dL — AB (ref 6.5–8.1)

## 2015-08-11 LAB — CBC
HCT: 36.1 % (ref 36.0–46.0)
Hemoglobin: 11.5 g/dL — ABNORMAL LOW (ref 12.0–15.0)
MCH: 27.3 pg (ref 26.0–34.0)
MCHC: 31.9 g/dL (ref 30.0–36.0)
MCV: 85.7 fL (ref 78.0–100.0)
PLATELETS: 226 10*3/uL (ref 150–400)
RBC: 4.21 MIL/uL (ref 3.87–5.11)
RDW: 16.3 % — AB (ref 11.5–15.5)
WBC: 4.6 10*3/uL (ref 4.0–10.5)

## 2015-08-11 SURGERY — LAPAROSCOPIC CHOLECYSTECTOMY
Anesthesia: General | Site: Abdomen

## 2015-08-11 MED ORDER — BUPIVACAINE-EPINEPHRINE (PF) 0.25% -1:200000 IJ SOLN
INTRAMUSCULAR | Status: AC
  Filled 2015-08-11: qty 30

## 2015-08-11 MED ORDER — PHENYLEPHRINE 40 MCG/ML (10ML) SYRINGE FOR IV PUSH (FOR BLOOD PRESSURE SUPPORT)
PREFILLED_SYRINGE | INTRAVENOUS | Status: AC
  Filled 2015-08-11: qty 10

## 2015-08-11 MED ORDER — LABETALOL HCL 5 MG/ML IV SOLN
INTRAVENOUS | Status: DC | PRN
  Administered 2015-08-11: 5 mg via INTRAVENOUS

## 2015-08-11 MED ORDER — FENTANYL CITRATE (PF) 100 MCG/2ML IJ SOLN
25.0000 ug | INTRAMUSCULAR | Status: DC | PRN
  Administered 2015-08-11: 50 ug via INTRAVENOUS

## 2015-08-11 MED ORDER — PHENYLEPHRINE HCL 10 MG/ML IJ SOLN
INTRAMUSCULAR | Status: DC | PRN
  Administered 2015-08-11: 120 ug via INTRAVENOUS

## 2015-08-11 MED ORDER — FENTANYL CITRATE (PF) 250 MCG/5ML IJ SOLN
INTRAMUSCULAR | Status: AC
  Filled 2015-08-11: qty 5

## 2015-08-11 MED ORDER — EPHEDRINE SULFATE 50 MG/ML IJ SOLN
INTRAMUSCULAR | Status: AC
  Filled 2015-08-11: qty 1

## 2015-08-11 MED ORDER — SUGAMMADEX SODIUM 200 MG/2ML IV SOLN
INTRAVENOUS | Status: AC
  Filled 2015-08-11: qty 2

## 2015-08-11 MED ORDER — ROCURONIUM BROMIDE 100 MG/10ML IV SOLN
INTRAVENOUS | Status: DC | PRN
  Administered 2015-08-11: 40 mg via INTRAVENOUS
  Administered 2015-08-11: 10 mg via INTRAVENOUS

## 2015-08-11 MED ORDER — SODIUM CHLORIDE 0.9 % IR SOLN
Status: DC | PRN
  Administered 2015-08-11 (×2): 1000 mL

## 2015-08-11 MED ORDER — ROCURONIUM BROMIDE 50 MG/5ML IV SOLN
INTRAVENOUS | Status: AC
  Filled 2015-08-11: qty 1

## 2015-08-11 MED ORDER — PROPOFOL 10 MG/ML IV BOLUS
INTRAVENOUS | Status: DC | PRN
  Administered 2015-08-11: 160 mg via INTRAVENOUS

## 2015-08-11 MED ORDER — DEXAMETHASONE SODIUM PHOSPHATE 4 MG/ML IJ SOLN
INTRAMUSCULAR | Status: DC | PRN
  Administered 2015-08-11: 4 mg via INTRAVENOUS

## 2015-08-11 MED ORDER — OXYCODONE HCL 5 MG PO TABS: 5.0000 mg | ORAL_TABLET | Freq: Once | ORAL | Status: DC | PRN

## 2015-08-11 MED ORDER — ONDANSETRON HCL 4 MG/2ML IJ SOLN
INTRAMUSCULAR | Status: DC | PRN
Start: 2015-08-11 — End: 2015-08-11
  Administered 2015-08-11: 4 mg via INTRAVENOUS

## 2015-08-11 MED ORDER — 0.9 % SODIUM CHLORIDE (POUR BTL) OPTIME
TOPICAL | Status: DC | PRN
  Administered 2015-08-11: 1000 mL

## 2015-08-11 MED ORDER — FENTANYL CITRATE (PF) 100 MCG/2ML IJ SOLN
INTRAMUSCULAR | Status: DC | PRN
  Administered 2015-08-11 (×2): 50 ug via INTRAVENOUS
  Administered 2015-08-11: 100 ug via INTRAVENOUS

## 2015-08-11 MED ORDER — ONDANSETRON HCL 4 MG/2ML IJ SOLN
INTRAMUSCULAR | Status: AC
  Filled 2015-08-11: qty 2

## 2015-08-11 MED ORDER — SODIUM CHLORIDE 0.9 % IJ SOLN
INTRAMUSCULAR | Status: AC
  Filled 2015-08-11: qty 10

## 2015-08-11 MED ORDER — LIDOCAINE HCL (CARDIAC) 20 MG/ML IV SOLN
INTRAVENOUS | Status: AC
  Filled 2015-08-11: qty 5

## 2015-08-11 MED ORDER — OXYCODONE HCL 5 MG PO TABS: 5.0000 mg | ORAL_TABLET | ORAL | Status: DC | PRN

## 2015-08-11 MED ORDER — DEXAMETHASONE SODIUM PHOSPHATE 4 MG/ML IJ SOLN
INTRAMUSCULAR | Status: AC
  Filled 2015-08-11: qty 1

## 2015-08-11 MED ORDER — OXYCODONE HCL 5 MG/5ML PO SOLN: 5.0000 mg | Freq: Once | ORAL | Status: DC | PRN

## 2015-08-11 MED ORDER — BUPIVACAINE-EPINEPHRINE 0.25% -1:200000 IJ SOLN
INTRAMUSCULAR | Status: DC | PRN
  Administered 2015-08-11: 16.5 mL

## 2015-08-11 MED ORDER — SUGAMMADEX SODIUM 200 MG/2ML IV SOLN
INTRAVENOUS | Status: DC | PRN
  Administered 2015-08-11: 200 mg via INTRAVENOUS

## 2015-08-11 MED ORDER — ONDANSETRON HCL 4 MG/2ML IJ SOLN: 4.0000 mg | Freq: Once | INTRAMUSCULAR | Status: DC | PRN

## 2015-08-11 MED ORDER — LIDOCAINE HCL (CARDIAC) 20 MG/ML IV SOLN
INTRAVENOUS | Status: DC | PRN
  Administered 2015-08-11: 40 mg via INTRAVENOUS

## 2015-08-11 MED ORDER — MIDAZOLAM HCL 2 MG/2ML IJ SOLN
INTRAMUSCULAR | Status: AC
  Filled 2015-08-11: qty 2

## 2015-08-11 MED ORDER — PROPOFOL 10 MG/ML IV BOLUS
INTRAVENOUS | Status: AC
  Filled 2015-08-11: qty 40

## 2015-08-11 MED ORDER — LABETALOL HCL 5 MG/ML IV SOLN
INTRAVENOUS | Status: AC
  Filled 2015-08-11: qty 4

## 2015-08-11 MED ORDER — GLYCOPYRROLATE 0.2 MG/ML IJ SOLN
INTRAMUSCULAR | Status: AC
  Filled 2015-08-11: qty 1

## 2015-08-11 MED ORDER — FENTANYL CITRATE (PF) 100 MCG/2ML IJ SOLN
INTRAMUSCULAR | Status: AC
  Filled 2015-08-11: qty 2

## 2015-08-11 MED ORDER — LACTATED RINGERS IV SOLN
INTRAVENOUS | Status: DC
  Administered 2015-08-11: 10:00:00 via INTRAVENOUS

## 2015-08-11 MED ORDER — SUCCINYLCHOLINE CHLORIDE 20 MG/ML IJ SOLN
INTRAMUSCULAR | Status: AC
  Filled 2015-08-11: qty 1

## 2015-08-11 SURGICAL SUPPLY — 46 items
APPLIER CLIP 5 13 M/L LIGAMAX5 (MISCELLANEOUS) ×4
APR CLP MED LRG 5 ANG JAW (MISCELLANEOUS) ×2
BAG SPEC RTRVL 10 TROC 200 (ENDOMECHANICALS) ×2
BLADE SURG ROTATE 9660 (MISCELLANEOUS) IMPLANT
CANISTER SUCTION 2500CC (MISCELLANEOUS) ×4 IMPLANT
CHLORAPREP W/TINT 26ML (MISCELLANEOUS) ×4 IMPLANT
CLIP APPLIE 5 13 M/L LIGAMAX5 (MISCELLANEOUS) ×2 IMPLANT
COVER MAYO STAND STRL (DRAPES) ×4 IMPLANT
COVER SURGICAL LIGHT HANDLE (MISCELLANEOUS) ×4 IMPLANT
DRAPE C-ARM 42X72 X-RAY (DRAPES) ×4 IMPLANT
ELECT REM PT RETURN 9FT ADLT (ELECTROSURGICAL) ×4
ELECTRODE REM PT RTRN 9FT ADLT (ELECTROSURGICAL) ×2 IMPLANT
FILTER SMOKE EVAC LAPAROSHD (FILTER) IMPLANT
GLOVE BIO SURGEON STRL SZ8 (GLOVE) ×4 IMPLANT
GLOVE BIOGEL PI IND STRL 7.0 (GLOVE) ×1 IMPLANT
GLOVE BIOGEL PI IND STRL 8 (GLOVE) ×2 IMPLANT
GLOVE BIOGEL PI INDICATOR 7.0 (GLOVE) ×2
GLOVE BIOGEL PI INDICATOR 8 (GLOVE) ×2
GLOVE SURG SS PI 7.0 STRL IVOR (GLOVE) ×3 IMPLANT
GOWN STRL REUS W/ TWL LRG LVL3 (GOWN DISPOSABLE) ×4 IMPLANT
GOWN STRL REUS W/ TWL XL LVL3 (GOWN DISPOSABLE) ×2 IMPLANT
GOWN STRL REUS W/TWL LRG LVL3 (GOWN DISPOSABLE) ×8
GOWN STRL REUS W/TWL XL LVL3 (GOWN DISPOSABLE) ×4
KIT BASIN OR (CUSTOM PROCEDURE TRAY) ×4 IMPLANT
KIT ROOM TURNOVER OR (KITS) ×4 IMPLANT
L-HOOK LAP DISP 36CM (ELECTROSURGICAL) ×4
LHOOK LAP DISP 36CM (ELECTROSURGICAL) ×2 IMPLANT
LIQUID BAND (GAUZE/BANDAGES/DRESSINGS) ×4 IMPLANT
NEEDLE 22X1 1/2 (OR ONLY) (NEEDLE) ×4 IMPLANT
NS IRRIG 1000ML POUR BTL (IV SOLUTION) ×4 IMPLANT
PAD ARMBOARD 7.5X6 YLW CONV (MISCELLANEOUS) ×4 IMPLANT
PENCIL BUTTON HOLSTER BLD 10FT (ELECTRODE) ×4 IMPLANT
POUCH RETRIEVAL ECOSAC 10 (ENDOMECHANICALS) ×2 IMPLANT
POUCH RETRIEVAL ECOSAC 10MM (ENDOMECHANICALS) ×2
SCISSORS LAP 5X35 DISP (ENDOMECHANICALS) ×4 IMPLANT
SET CHOLANGIOGRAPH 5 50 .035 (SET/KITS/TRAYS/PACK) ×4 IMPLANT
SET IRRIG TUBING LAPAROSCOPIC (IRRIGATION / IRRIGATOR) ×4 IMPLANT
SLEEVE ENDOPATH XCEL 5M (ENDOMECHANICALS) ×8 IMPLANT
SPECIMEN JAR SMALL (MISCELLANEOUS) ×4 IMPLANT
SUT VIC AB 4-0 PS2 27 (SUTURE) ×4 IMPLANT
TOWEL OR 17X24 6PK STRL BLUE (TOWEL DISPOSABLE) ×4 IMPLANT
TOWEL OR 17X26 10 PK STRL BLUE (TOWEL DISPOSABLE) ×4 IMPLANT
TRAY LAPAROSCOPIC MC (CUSTOM PROCEDURE TRAY) ×4 IMPLANT
TROCAR XCEL BLUNT TIP 100MML (ENDOMECHANICALS) ×4 IMPLANT
TROCAR XCEL NON-BLD 5MMX100MML (ENDOMECHANICALS) ×4 IMPLANT
TUBING INSUFFLATION (TUBING) ×4 IMPLANT

## 2015-08-11 NOTE — Anesthesia Preprocedure Evaluation (Addendum)
Anesthesia Evaluation  Patient identified by MRN, date of birth, ID band Patient awake    Reviewed: Allergy & Precautions, NPO status , Patient's Chart, lab work & pertinent test results  Airway Mallampati: II  TM Distance: >3 FB Neck ROM: Full    Dental  (+) Partial Lower, Teeth Intact, Dental Advisory Given   Pulmonary    breath sounds clear to auscultation       Cardiovascular hypertension, Pt. on medications  Rhythm:Regular Rate:Normal     Neuro/Psych    GI/Hepatic   Endo/Other  Morbid obesity  Renal/GU      Musculoskeletal   Abdominal (+) + obese,   Peds  Hematology   Anesthesia Other Findings   Reproductive/Obstetrics                           BP Readings from Last 3 Encounters:  08/11/15 141/64  12/19/14 154/69  11/10/14 164/70   CBC    Component Value Date/Time   WBC 4.6 08/11/2015 0522   RBC 4.21 08/11/2015 0522   RBC 3.75* 03/21/2010 1518   HGB 11.5* 08/11/2015 0522   HCT 36.1 08/11/2015 0522   PLT 226 08/11/2015 0522   MCV 85.7 08/11/2015 0522   MCH 27.3 08/11/2015 0522   MCHC 31.9 08/11/2015 0522   RDW 16.3* 08/11/2015 0522   LYMPHSABS 3.1 03/21/2010 0550   MONOABS 0.4 03/21/2010 0550   EOSABS 0.2 03/21/2010 0550   BASOSABS 0.0 03/21/2010 0550      Chemistry      Component Value Date/Time   NA 143 08/11/2015 0522   K 4.1 08/11/2015 0522   CL 111 08/11/2015 0522   CO2 22 08/11/2015 0522   BUN 8 08/11/2015 0522   CREATININE 1.01* 08/11/2015 0522      Component Value Date/Time   CALCIUM 8.9 08/11/2015 0522   ALKPHOS 113 08/11/2015 0522   AST 229* 08/11/2015 0522   ALT 283* 08/11/2015 0522   BILITOT 0.8 08/11/2015 0522     EKG 08/10/15: Sinus rhythm  [place] ABD 08/10/15: Cholelithiasis with positive Murphy's sign. These changes are associated with acute cholecystitis in the appropriate clinical setting.  Anesthesia Physical Anesthesia Plan  ASA:  II  Anesthesia Plan:    Post-op Pain Management:    Induction: Intravenous  Airway Management Planned: Oral ETT  Additional Equipment:   Intra-op Plan:   Post-operative Plan: Extubation in OR  Informed Consent: I have reviewed the patients History and Physical, chart, labs and discussed the procedure including the risks, benefits and alternatives for the proposed anesthesia with the patient or authorized representative who has indicated his/her understanding and acceptance.   Dental advisory given  Plan Discussed with: CRNA and Anesthesiologist  Anesthesia Plan Comments: (Cholelithiasis with probable CBD stone Hypertension Mild asthma lungs clear  Plan GA with oral ETT  Kipp Brood)        Anesthesia Quick Evaluation

## 2015-08-11 NOTE — Transfer of Care (Signed)
Immediate Anesthesia Transfer of Care Note  Patient: Carol Carlson  Procedure(s) Performed: Procedure(s): LAPAROSCOPIC CHOLECYSTECTOMY (N/A)  Patient Location: PACU  Anesthesia Type:General  Level of Consciousness: alert , patient cooperative and lethargic  Airway & Oxygen Therapy: Patient Spontanous Breathing and Patient connected to nasal cannula oxygen  Post-op Assessment: Report given to RN, Post -op Vital signs reviewed and stable and Patient moving all extremities X 4  Post vital signs: Reviewed and stable  Last Vitals:  Filed Vitals:   08/11/15 0214 08/11/15 0500  BP: 148/62 141/64  Pulse: 68 81  Temp: 37.1 C 36.2 C  Resp: 18 16    Complications: No apparent anesthesia complications

## 2015-08-11 NOTE — Anesthesia Postprocedure Evaluation (Signed)
Anesthesia Post Note  Patient: Carol Carlson  Procedure(s) Performed: Procedure(s) (LRB): LAPAROSCOPIC CHOLECYSTECTOMY (N/A)  Patient location during evaluation: PACU Anesthesia Type: General Level of consciousness: awake, awake and alert and oriented Pain management: pain level controlled Vital Signs Assessment: post-procedure vital signs reviewed and stable Respiratory status: spontaneous breathing, nonlabored ventilation and respiratory function stable Cardiovascular status: blood pressure returned to baseline Anesthetic complications: no    Last Vitals:  Filed Vitals:   08/11/15 1200 08/11/15 1215  BP: 133/51   Pulse: 66 68  Temp: 36.4 C   Resp: 13 10    Last Pain:  Filed Vitals:   08/11/15 1305  PainSc: 5                  Glennon Kopko COKER

## 2015-08-11 NOTE — Op Note (Signed)
08/10/2015 - 08/11/2015  10:58 AM  PATIENT:  Carol Carlson  75 y.o. female  PRE-OPERATIVE DIAGNOSIS:  acute cholecystitis with cholelithiasis  POST-OPERATIVE DIAGNOSIS:  acute cholecystitis with cholelithiasis  PROCEDURE:  Procedure(s): LAPAROSCOPIC CHOLECYSTECTOMY  SURGEON:  Surgeon(s): Violeta Gelinas, MD  ASSISTANTS: none   ANESTHESIA:   local and general  EBL:  Total I/O In: 600 [I.V.:600] Out: -   BLOOD ADMINISTERED:none  DRAINS: none   SPECIMEN:  Excision  DISPOSITION OF SPECIMEN:  PATHOLOGY  COUNTS:  YES  DICTATION: .Dragon Dictation Findings: Acute cholecystitis, very short cystic duct  Procedure in detail: [name] presents for cholecystectomy. She received intravenous antibiotics. Informed consent was obtained. She was brought to the operating room and general endotracheal anesthesia was administered by the anesthesia staff. Her abdomen was prepped and draped in sterile fashion. Time out procedure was performed.The infraumbilical region was infiltrated with local. Infraumbilical incision was made. Subcutaneous tissues were dissected down revealing the anterior fascia. This was divided sharply along the midline. Peritoneal cavity was entered under direct vision without complication. A 0 Vicryl pursestring was placed around the fascial opening. Hassan trocar was inserted into the abdomen. The abdomen was insufflated with carbon dioxide in standard fashion. Under direct vision a 5 mm epigastric and 5 mm right lateral ports 2 were placed. Local was used at each port site. Abdomen the gallbladder was retracted superior medially. The infundibulum was retracted inferolaterally. The gallbladder was acutely inflamed. Dissection began laterally and progressed medially. We identified the cystic artery overlying the cystic duct. Cystic artery was dissected and clipped twice proximally and once distally. It was then divided. Cystic duct was further dissected. It was very short but  we were able to get a critical view between the cystic duct, the infundibulum, and liver. Due to her normal liver function tests in very short cystic duct, cholangiogram was not done. 3 clips were placed proximally on the cystic duct, one was placed distally and it was divided. It was taken off the liver bed with Bovie cautery achieving excellent hemostasis. Gallbladder was placed in a bag and removed from the abdomen via the infraumbilical port site. Liver bed was then cauterized to get excellent hemostasis. The area was irrigated until clear. Clips remain in good position. Liver bed was dry. Irrigation was evacuated. Ports were then removed under direct vision. Pneumoperitoneum was released. Infraumbilical fascia was closed by tying the pursestring. All 4 wounds were irrigated and the skin of each was closed with running 4 Vicryl subcuticular followed by Dermabond. All counts were correct. She tolerated procedure well without apparent complication was taken recovery in stable condition.  PATIENT DISPOSITION:  PACU - hemodynamically stable.   Delay start of Pharmacological VTE agent (>24hrs) due to surgical blood loss or risk of bleeding:  no  Violeta Gelinas, MD, MPH, FACS Pager: [phone]  2/20/201710:58 AM

## 2015-08-11 NOTE — Anesthesia Procedure Notes (Addendum)
Procedure Name: Intubation Date/Time: 08/11/2015 10:13 AM Performed by: Fabian November Pre-anesthesia Checklist: Patient identified, Patient being monitored, Timeout performed, Emergency Drugs available and Suction available Patient Re-evaluated:Patient Re-evaluated prior to inductionOxygen Delivery Method: Circle System Utilized Preoxygenation: Pre-oxygenation with 100% oxygen Intubation Type: IV induction Ventilation: Mask ventilation without difficulty and Oral airway inserted - appropriate to patient size Laryngoscope Size: Miller and 3 Grade View: Grade I Tube type: Oral Tube size: 7.5 mm Number of attempts: 1 Airway Equipment and Method: Stylet Placement Confirmation: ETT inserted through vocal cords under direct vision,  positive ETCO2 and breath sounds checked- equal and bilateral Secured at: 22 cm Tube secured with: Tape Dental Injury: Teeth and Oropharynx as per pre-operative assessment

## 2015-08-11 NOTE — Progress Notes (Signed)
Day of Surgery  Subjective: Some RUQ pain  Objective: Vital signs in last 24 hours: Temp:  [97.1 F (36.2 C)-98.8 F (37.1 C)] 97.1 F (36.2 C) (02/20 0500) Pulse Rate:  [68-81] 81 (02/20 0500) Resp:  [16-22] 16 (02/20 0500) BP: (127-165)/(62-116) 141/64 mmHg (02/20 0500) SpO2:  [94 %-100 %] 98 % (02/20 0500) Weight:  [92.2 kg (203 lb 4.2 oz)] 92.2 kg (203 lb 4.2 oz) (02/19 1540) Last BM Date: 08/09/15  Intake/Output from previous day: 02/19 0701 - 02/20 0700 In: 1477 [P.O.:120; I.V.:1357] Out: -  Intake/Output this shift:    General appearance: cooperative Resp: clear to auscultation bilaterally Cardio: regular rate and rhythm GI: soft, mild TTP RUQ  Lab Results:   Recent Labs  08/09/15 2356 08/11/15 0522  WBC 10.6* 4.6  HGB 11.6* 11.5*  HCT 37.1 36.1  PLT 266 226   BMET  Recent Labs  08/09/15 2356 08/11/15 0522  NA 141 143  K 3.7 4.1  CL 108 111  CO2 23 22  GLUCOSE 160* 86  BUN 11 8  CREATININE 0.86 1.01*  CALCIUM 9.6 8.9   PT/INR No results for input(s): LABPROT, INR in the last 72 hours. ABG No results for input(s): PHART, HCO3 in the last 72 hours.  Invalid input(s): PCO2, PO2  Studies/Results: US Abdomen Limited  08/10/2015  CLINICAL DATA:  Nausea, vomiting, and abdominal pain for 1 day. EXAM: US ABDOMEN LIMITED - RIGHT UPPER QUADRANT COMPARISON:  CT 03/21/2010 FINDINGS: Gallbladder: Stones in the gallbladder, largest measuring 1.4 cm diameter. Small amount of layering sludge. No gallbladder wall thickening but Murphy's sign is positive. Common bile duct: Diameter: 2.8 mm, normal Liver: No focal lesion identified. Within normal limits in parenchymal echogenicity. IMPRESSION: Cholelithiasis with positive Murphy's sign. These changes are associated with acute cholecystitis in the appropriate clinical setting. Electronically Signed   By: Burman Nieves M.D.   On: 08/10/2015 06:00    Anti-infectives: Anti-infectives    Start     Dose/Rate Route  Frequency Ordered Stop   08/10/15 0930  cefTRIAXone (ROCEPHIN) 2 g in dextrose 5 % 50 mL IVPB     2 g 100 mL/hr over 30 Minutes Intravenous Every 24 hours 08/10/15 0918     08/10/15 0645  cefTRIAXone (ROCEPHIN) 2 g in dextrose 5 % 50 mL IVPB     2 g 100 mL/hr over 30 Minutes Intravenous  Once 08/10/15 1610 08/10/15 1026      Assessment/Plan: Cholecystitis with cholelithiasis - for laparoscopic cholecystectomy with cholangiogram this AM. Procedure, risks, and benefits D/W her and she agrees. On Rocephin.  LOS: 1 day    Telina Kleckley E 08/11/2015

## 2015-08-12 ENCOUNTER — Encounter (HOSPITAL_COMMUNITY): Payer: Self-pay | Admitting: General Surgery

## 2015-08-12 DIAGNOSIS — K219 Gastro-esophageal reflux disease without esophagitis: Secondary | ICD-10-CM

## 2015-08-12 DIAGNOSIS — I10 Essential (primary) hypertension: Secondary | ICD-10-CM | POA: Diagnosis present

## 2015-08-12 HISTORY — DX: Gastro-esophageal reflux disease without esophagitis: K21.9

## 2015-08-12 HISTORY — DX: Essential (primary) hypertension: I10

## 2015-08-12 MED ORDER — ACETAMINOPHEN 325 MG PO TABS
650.0000 mg | ORAL_TABLET | Freq: Four times a day (QID) | ORAL | Status: DC | PRN
  Administered 2015-08-12: 650 mg via ORAL
  Filled 2015-08-12: qty 2

## 2015-08-12 MED ORDER — HYDROCODONE-ACETAMINOPHEN 5-325 MG PO TABS: 1.0000 | ORAL_TABLET | ORAL | Status: DC | PRN

## 2015-08-12 MED ORDER — ACETAMINOPHEN 325 MG PO TABS: 650.0000 mg | ORAL_TABLET | Freq: Four times a day (QID) | ORAL | Status: AC | PRN

## 2015-08-12 NOTE — Progress Notes (Signed)
1 Day Post-Op  Subjective: Had some nausea and vomiting last PM and was placed back on clears.  Sites all look fine, she does not work, and is home alone during the day most of the time.    Objective: Vital signs in last 24 hours: Temp:  [97.6 F (36.4 C)-98.4 F (36.9 C)] 98.4 F (36.9 C) (02/21 0535) Pulse Rate:  [53-107] 95 (02/21 0535) Resp:  [10-18] 18 (02/21 0535) BP: (116-161)/(51-89) 133/76 mmHg (02/21 0535) SpO2:  [93 %-99 %] 96 % (02/21 0535) Last BM Date: 08/08/15 (per pt) PO 720  Clears Urine not recorded Afebrile, a little tachycardic,VSS No labs Intake/Output from previous day: 02/20 0701 - 02/21 0700 In: 1820 [P.O.:720; I.V.:1100] Out: -  Intake/Output this shift:    General appearance: alert, cooperative and no distress Resp: clear to auscultation bilaterally GI: sore, soft, + BS, sites all look fine  Lab Results:   Recent Labs  08/09/15 2356 08/11/15 0522  WBC 10.6* 4.6  HGB 11.6* 11.5*  HCT 37.1 36.1  PLT 266 226    BMET  Recent Labs  08/09/15 2356 08/11/15 0522  NA 141 143  K 3.7 4.1  CL 108 111  CO2 23 22  GLUCOSE 160* 86  BUN 11 8  CREATININE 0.86 1.01*  CALCIUM 9.6 8.9   PT/INR No results for input(s): LABPROT, INR in the last 72 hours.   Recent Labs Lab 08/09/15 2356 08/11/15 0522  AST 212* 229*  ALT 107* 283*  ALKPHOS 91 113  BILITOT 0.7 0.8  PROT 7.1 6.4*  ALBUMIN 3.7 3.0*     Lipase     Component Value Date/Time   LIPASE 33 08/09/2015 2356     Studies/Results: No results found.  Medications: . acetaminophen  1,000 mg Oral 4 times per day  . cefTRIAXone (ROCEPHIN)  IV  2 g Intravenous Q24H  . docusate sodium  100 mg Oral BID  . enoxaparin (LOVENOX) injection  40 mg Subcutaneous Q24H  .  morphine injection  4 mg Intravenous Once  . pantoprazole (PROTONIX) IV  40 mg Intravenous QHS   . sodium chloride 100 mL/hr at 08/11/15 2212  . lactated ringers     Prior to Admission medications   Medication Sig  Start Date End Date Taking? Authorizing Provider  albuterol (PROVENTIL HFA;VENTOLIN HFA) 108 (90 BASE) MCG/ACT inhaler Inhale 1-2 puffs into the lungs every 6 (six) hours as needed for wheezing or shortness of breath. 11/10/14  Yes Marisa Severin, MD  amLODipine-benazepril (LOTREL) 10-40 MG capsule Take 1 capsule by mouth daily.   Yes Historical Provider, MD  Aspirin-Salicylamide-Caffeine (BC HEADACHE POWDER PO) Take 0.5 Packages by mouth daily as needed (pain).   Yes Historical Provider, MD  cetirizine (ZYRTEC) 10 MG tablet Take 10 mg by mouth daily as needed for allergies.   Yes Historical Provider, MD  esomeprazole (NEXIUM) 20 MG capsule Take 20 mg by mouth daily as needed (acid reflux).   Yes Historical Provider, MD    Assessment/Plan Acute cholecystitis Hx of hypertension GERD ABX: completed 2 days of Rocephin DVT:  Lovenox/SCD   Plan:  Mobilize and advance diet, if she does well let her go home after lunch.      LOS: 2 days    Carol Carlson 08/12/2015

## 2015-08-12 NOTE — Discharge Summary (Signed)
Physician Discharge Summary  Patient ID: Carol Carlson MRN: [id number] DOB/AGE: [birth year] 75 y.o.  Admit date: 08/10/2015 Discharge date: 08/12/2015  Admission Diagnoses:  Acute cholecystitis Hx of hypertension GERD Discharge Diagnoses:  Same  Active Problems:   Acute cholecystitis   Essential hypertension   GERD (gastroesophageal reflux disease)   PROCEDURES: Laparoscopic cholecystectomy, 08/11/15, Dr. Gloris Ham Course: Pt presented to ED with RUQ pain, mild WBC elevation, and transaminase elevation.  [place] was consistent with acute cholecystitis.  She as admitted by Dr. Lindie Spruce.  She was taken to the OR the following day.  She tolerated the procedure well.  She was ready for discharge after breakfast the first post op day.  Condition on D/C:  Improved.   Disposition: 01-Home or Self Care     Medication List    STOP taking these medications        BC HEADACHE POWDER PO      TAKE these medications        acetaminophen 325 MG tablet  Commonly known as:  TYLENOL  Take 2 tablets (650 mg total) by mouth every 6 (six) hours as needed for mild pain, moderate pain, fever or headache.     albuterol 108 (90 Base) MCG/ACT inhaler  Commonly known as:  PROVENTIL HFA;VENTOLIN HFA  Inhale 1-2 puffs into the lungs every 6 (six) hours as needed for wheezing or shortness of breath.     amLODipine-benazepril 10-40 MG capsule  Commonly known as:  LOTREL  Take 1 capsule by mouth daily.     cetirizine 10 MG tablet  Commonly known as:  ZYRTEC  Take 10 mg by mouth daily as needed for allergies.     esomeprazole 20 MG capsule  Commonly known as:  NEXIUM  Take 20 mg by mouth daily as needed (acid reflux).     HYDROcodone-acetaminophen 5-325 MG tablet  Commonly known as:  NORCO/VICODIN  Take 1-2 tablets by mouth every 4 (four) hours as needed for moderate pain.       Follow-up Information    Follow up with [facility] On 08/27/2015.   Specialty:  General  Surgery   Why:  Your appointment is at 11:45 PM, be at the office 30 minutes early for check in.   Contact information:   [address] [place] [address] [phone]       Signed: Sherrie George 08/12/2015, 3:50 PM

## 2015-08-12 NOTE — Progress Notes (Signed)
Carol Carlson to be D/C'd Home per MD order.  Discussed with the patient and all questions fully answered.  VSS. Surgical sites clean, dry and intact with skin glue.  IV catheter discontinued intact. Site without signs and symptoms of complications. Dressing and pressure applied.  An After Visit Summary was printed and given to the patient. Patient received prescription.  D/c education completed with patient/family including follow up instructions, medication list, d/c activities limitations if indicated, with other d/c instructions as indicated by MD - patient able to verbalize understanding, all questions fully answered.   Patient instructed to return to ED, call 911, or call MD for any changes in condition.   Patient to be escorted via WC, and D/C home via private auto.  [name], [name] 08/12/2015 12:03 PM

## 2015-08-12 NOTE — Discharge Instructions (Signed)
Laparoscopic Cholecystectomy, Care After °Refer to this sheet in the next few weeks. These instructions provide you with information about caring for yourself after your procedure. Your health care provider may also give you more specific instructions. Your treatment has been planned according to current medical practices, but problems sometimes occur. Call your health care provider if you have any problems or questions after your procedure. °WHAT TO EXPECT AFTER THE PROCEDURE °After your procedure, it is common to have: °· Pain at your incision sites. You will be given pain medicines to control your pain. °· Mild nausea or vomiting. This should improve after the first 24 hours. °· Bloating and possible shoulder pain from the gas that was used during the procedure. This will improve after the first 24 hours. °HOME CARE INSTRUCTIONS °Incision Care °· Follow instructions from your health care provider about how to take care of your incisions. Make sure you: °¨ Wash your hands with soap and water before you change your bandage (dressing). If soap and water are not available, use hand sanitizer. °¨ Change your dressing as told by your health care provider. °¨ Leave stitches (sutures), skin glue, or adhesive strips in place. These skin closures may need to be in place for 2 weeks or longer. If adhesive strip edges start to loosen and curl up, you may trim the loose edges. Do not remove adhesive strips completely unless your health care provider tells you to do that. °· Do not take baths, swim, or use a hot tub until your health care provider approves. Ask your health care provider if you can take showers. You may only be allowed to take sponge baths for bathing. °General Instructions °· Take over-the-counter and prescription medicines only as told by your health care provider. °· Do not drive or operate heavy machinery while taking prescription pain medicine. °· Return to your normal diet as told by your health care  provider. °· Do not lift anything that is heavier than 10 lb (4.5 kg). °· Do not play contact sports for one week or until your health care provider approves. °SEEK MEDICAL CARE IF:  °· You have redness, swelling, or pain at the site of your incision. °· You have fluid, blood, or pus coming from your incision. °· You notice a bad smell coming from your incision area. °· Your surgical incisions break open. °· You have a fever. °SEEK IMMEDIATE MEDICAL CARE IF: °· You develop a rash. °· You have difficulty breathing. °· You have chest pain. °· You have increasing pain in your shoulders (shoulder strap areas). °· You faint or have dizzy episodes while you are standing. °· You have severe pain in your abdomen. °· You have nausea or vomiting that lasts for more than one day. °  °This information is not intended to replace advice given to you by your health care provider. Make sure you discuss any questions you have with your health care provider. °  °Document Released: 06/07/2005 Document Revised: 02/26/2015 Document Reviewed: 01/17/2013 °Elsevier Interactive Patient Education ©2016 [employer] °CCS ______[facility] °LAPAROSCOPIC SURGERY: POST OP INSTRUCTIONS °Always review your discharge instruction sheet given to you by the facility where your surgery was performed. °IF YOU HAVE DISABILITY OR FAMILY LEAVE FORMS, YOU MUST BRING THEM TO THE OFFICE FOR PROCESSING.   °DO NOT GIVE THEM TO YOUR DOCTOR. ° °1. A prescription for pain medication may be given to you upon discharge.  Take your pain medication as prescribed, if needed.  If narcotic   pain medicine is not needed, then you may take acetaminophen (Tylenol) or ibuprofen (Advil) as needed. °2. Take your usually prescribed medications unless otherwise directed. °3. If you need a refill on your pain medication, please contact your pharmacy.  They will contact our office to request authorization. Prescriptions will not be filled after 5pm or on  week-ends. °4. You should follow a light diet the first few days after arrival home, such as soup and crackers, etc.  Be sure to include lots of fluids daily. °5. Most patients will experience some swelling and bruising in the area of the incisions.  Ice packs will help.  Swelling and bruising can take several days to resolve.  °6. It is common to experience some constipation if taking pain medication after surgery.  Increasing fluid intake and taking a stool softener (such as Colace) will usually help or prevent this problem from occurring.  A mild laxative (Milk of Magnesia or Miralax) should be taken according to package instructions if there are no bowel movements after 48 hours. °7. Unless discharge instructions indicate otherwise, you may remove your bandages 24-48 hours after surgery, and you may shower at that time.  You may have steri-strips (small skin tapes) in place directly over the incision.  These strips should be left on the skin for 7-10 days.  If your surgeon used skin glue on the incision, you may shower in 24 hours.  The glue will flake off over the next 2-3 weeks.  Any sutures or staples will be removed at the office during your follow-up visit. °8. ACTIVITIES:  You may resume regular (light) daily activities beginning the next day--such as daily self-care, walking, climbing stairs--gradually increasing activities as tolerated.  You may have sexual intercourse when it is comfortable.  Refrain from any heavy lifting or straining until approved by your doctor. °a. You may drive when you are no longer taking prescription pain medication, you can comfortably wear a seatbelt, and you can safely maneuver your car and apply brakes. °b. RETURN TO WORK:  __________________________________________________________ °9. You should see your doctor in the office for a follow-up appointment approximately 2-3 weeks after your surgery.  Make sure that you call for this appointment within a day or two after you  arrive home to insure a convenient appointment time. °10. OTHER INSTRUCTIONS: __________________________________________________________________________________________________________________________ __________________________________________________________________________________________________________________________ °WHEN TO CALL YOUR DOCTOR: °1. Fever over 101.0 °2. Inability to urinate °3. Continued bleeding from incision. °4. Increased pain, redness, or drainage from the incision. °5. Increasing abdominal pain ° °The clinic staff is available to answer your questions during regular business hours.  Please don’t hesitate to call and ask to speak to one of the nurses for clinical concerns.  If you have a medical emergency, go to the nearest emergency room or call 911.  A surgeon from [facility] is always on call at the hospital. °[address], [address], [place], [place]  [address] ? [address] [address], [place], [place]   [address] °[phone] ? [phone] ? FAX [phone] °Web site: [email or web address] ° °

## 2016-01-07 ENCOUNTER — Encounter (HOSPITAL_COMMUNITY): Payer: Self-pay | Admitting: Emergency Medicine

## 2016-01-07 DIAGNOSIS — I10 Essential (primary) hypertension: Secondary | ICD-10-CM | POA: Diagnosis not present

## 2016-01-07 DIAGNOSIS — M79604 Pain in right leg: Secondary | ICD-10-CM | POA: Diagnosis present

## 2016-01-07 DIAGNOSIS — M25551 Pain in right hip: Secondary | ICD-10-CM | POA: Insufficient documentation

## 2016-01-07 DIAGNOSIS — Z79899 Other long term (current) drug therapy: Secondary | ICD-10-CM | POA: Diagnosis not present

## 2016-01-07 NOTE — ED Notes (Signed)
Pt. reports left calf and right leg pain onset 2 weeks ago unrelieved by OTC pain medications , denies injury , ambulatory /respirations unlabored .

## 2016-01-08 ENCOUNTER — Emergency Department (HOSPITAL_COMMUNITY): Payer: Medicare Other

## 2016-01-08 ENCOUNTER — Emergency Department (HOSPITAL_COMMUNITY)
Admission: EM | Admit: 2016-01-08 | Discharge: 2016-01-08 | Disposition: A | Discharge status: Home / Self Care | Payer: Medicare Other | Attending: Emergency Medicine | Admitting: Emergency Medicine

## 2016-01-08 DIAGNOSIS — M25551 Pain in right hip: Secondary | ICD-10-CM | POA: Diagnosis not present

## 2016-01-08 MED ORDER — KETOROLAC TROMETHAMINE 15 MG/ML IJ SOLN
15.0000 mg | Freq: Once | INTRAMUSCULAR | Status: AC
  Administered 2016-01-08: 15 mg via INTRAMUSCULAR
  Filled 2016-01-08: qty 1

## 2016-01-08 MED ORDER — HYDROMORPHONE HCL 1 MG/ML IJ SOLN
0.7500 mg | Freq: Once | INTRAMUSCULAR | Status: AC
Start: 2016-01-08 — End: 2016-01-08
  Administered 2016-01-08: 0.75 mg via INTRAMUSCULAR
  Filled 2016-01-08: qty 1

## 2016-01-08 NOTE — ED Notes (Signed)
RN attempted to waste 0.25mg  dilaudid with another RN, but patient was out of Pyxis system. This RN wasted 0.25mg  dilaudid with Lewie LoronEmilie, Charity fundraiserN.

## 2016-01-08 NOTE — ED Notes (Signed)
Patient verbalized understanding of discharge instructions and denies any further needs or questions at this time. VS stable. Patient ambulatory with steady gait, escorted to ED entrance in wheelchair.   

## 2016-01-08 NOTE — ED Notes (Signed)
Patient ambulated to restroom with steady gait. Reports pain in R knee and L calf upon walking.

## 2016-01-08 NOTE — Discharge Instructions (Signed)

## 2016-01-08 NOTE — ED Provider Notes (Signed)
CSN: [id number]     Arrival date & time 01/07/16  2331 History  By signing my name below, I, Soijett Blue, attest that this documentation has been prepared under the direction and in the presence of Raeford RazorStephen Terianna Peggs, MD. Electronically Signed: Soijett Blue, ED Scribe. 01/08/2016. 3:54 AM.   Chief Complaint  Patient presents with  . Leg Pain      The history is provided by the patient. No language interpreter was used.     Carol Carlson is a 75 y.o. female with a medical hx of HTN who presents to the Emergency Department complaining of left leg pain onset 1 week. Pt reports that she stumbled prior to the onset of her symptoms. Denies any other injury, trauma, or fall. Pt notes that her left leg pain is worsened with movement. Denies alleviating factors. Pt denies pain anywhere else at this time. Pt is having associated symptoms of tingling to left leg and lower back pain. Pt reports that her lower back pain radiates to her left toes. She notes that she has tried [facility] powder and tylenol with no relief of her symptoms. She denies color change, wound, rash, swelling, numbness, and any other symptoms.    Past Medical History  Diagnosis Date  . Hypertension   . Seasonal allergies   . Essential hypertension 08/12/2015  . GERD (gastroesophageal reflux disease) 08/12/2015   Past Surgical History  Procedure Laterality Date  . Cholecystectomy N/A 08/11/2015    Procedure: LAPAROSCOPIC CHOLECYSTECTOMY;  Surgeon: Violeta GelinasBurke Thompson, MD;  [facility];  Service: General;  Laterality: N/A;   No family history on file. Social History  Substance Use Topics  . Smoking status: Never Smoker   . Smokeless tobacco: None  . Alcohol Use: No   OB History    No data available     Review of Systems    Allergies  Aspirin  Home Medications   Prior to Admission medications   Medication Sig Start Date End Date Taking? Authorizing Provider  acetaminophen (TYLENOL) 325 MG tablet Take 2 tablets (650 mg total)  by mouth every 6 (six) hours as needed for mild pain, moderate pain, fever or headache. 08/12/15   Sherrie GeorgeWillard Jennings, PA-C  albuterol (PROVENTIL HFA;VENTOLIN HFA) 108 (90 BASE) MCG/ACT inhaler Inhale 1-2 puffs into the lungs every 6 (six) hours as needed for wheezing or shortness of breath. 11/10/14   Marisa Severinlga Otter, MD  amLODipine-benazepril (LOTREL) 10-40 MG capsule Take 1 capsule by mouth daily.    Historical Provider, MD  cetirizine (ZYRTEC) 10 MG tablet Take 10 mg by mouth daily as needed for allergies.    Historical Provider, MD  esomeprazole (NEXIUM) 20 MG capsule Take 20 mg by mouth daily as needed (acid reflux).    Historical Provider, MD  HYDROcodone-acetaminophen (NORCO/VICODIN) 5-325 MG tablet Take 1-2 tablets by mouth every 4 (four) hours as needed for moderate pain. 08/12/15   Sherrie GeorgeWillard Jennings, PA-C   BP 173/77 mmHg  Pulse 79  Temp(Src) 97.9 F (36.6 C) (Oral)  Resp 20  Ht 5\' 3"  (1.6 m)  Wt 175 lb 7 oz (79.578 kg)  BMI 31.09 kg/m2  SpO2 100% Physical Exam  Constitutional: She is oriented to person, place, and time. She appears well-developed and well-nourished. No distress.  HENT:  Head: Normocephalic and atraumatic.  Eyes: EOM are normal.  Neck: Neck supple.  Cardiovascular: Normal rate, regular rhythm and normal heart sounds.  Exam reveals no gallop and no friction rub.   No murmur heard. Pulmonary/Chest:  Effort normal and breath sounds normal. No respiratory distress. She has no wheezes. She has no rales.  Abdominal: She exhibits no distension.  Musculoskeletal: Normal range of motion.       Right hip: She exhibits tenderness. She exhibits normal range of motion and normal strength.  Tenderness over right greater trochanter. No overlying skin changes. Can actively range right hip. Strength is 5/5. NVI.   Neurological: She is alert and oriented to person, place, and time.  Skin: Skin is warm and dry.  Psychiatric: She has a normal mood and affect. Her behavior is normal.   Nursing note and vitals reviewed.   ED Course  Procedures (including critical care time) DIAGNOSTIC STUDIES: Oxygen Saturation is 100% on RA, nl by my interpretation.    COORDINATION OF CARE: 3:47 AM Discussed treatment plan with pt at bedside which includes hip unilateral right xray and pt agreed to plan.    Imaging Review  Dg Hip Unilat With Pelvis 2-3 Views Right  Result Date: 01/08/2016 CLINICAL DATA:  RIGHT hip pain for 2 weeks.  No known injury. EXAM: DG HIP (WITH OR WITHOUT PELVIS) 2-3V RIGHT COMPARISON:  None. FINDINGS: Femoral heads are well formed and located. Hip joint spaces are intact. Moderate RIGHT hip joint space narrowing with superior lateral acetabular spurring and femoral head neck junction spurring. Mild LEFT fifth joint space narrowing marginal scarring. Sacroiliac joints are symmetric. No destructive bony lesions. Included soft tissue planes are non-suspicious. Phleboliths in the pelvis. IMPRESSION: Moderate RIGHT, mild LEFT hip osteoarthrosis. No acute fracture deformity or dislocation. Electronically Signed   By: Awilda Metro M.D.   On: 01/08/2016 04:28   No results found. I have personally reviewed and evaluated these images as part of my medical decision-making.   MDM   Final diagnoses:  Hip pain, right    I personally preformed the services scribed in my presence. The recorded information has been reviewed is accurate. Raeford Razor, MD.    Raeford Razor, MD 01/22/16 2224

## 2016-02-24 ENCOUNTER — Other Ambulatory Visit: Payer: Self-pay | Admitting: Internal Medicine

## 2016-02-24 DIAGNOSIS — E2839 Other primary ovarian failure: Secondary | ICD-10-CM

## 2016-12-23 ENCOUNTER — Other Ambulatory Visit: Payer: Self-pay | Admitting: Internal Medicine

## 2016-12-23 DIAGNOSIS — E2839 Other primary ovarian failure: Secondary | ICD-10-CM

## 2017-03-30 ENCOUNTER — Encounter (HOSPITAL_COMMUNITY): Payer: Self-pay | Admitting: Emergency Medicine

## 2017-03-30 ENCOUNTER — Inpatient Hospital Stay (HOSPITAL_COMMUNITY)
Admission: EM | Admit: 2017-03-30 | Discharge: 2017-04-06 | DRG: 391 | Disposition: A | Discharge status: Home / Self Care | Payer: Medicare Other | Attending: Internal Medicine | Admitting: Internal Medicine

## 2017-03-30 DIAGNOSIS — K5730 Diverticulosis of large intestine without perforation or abscess without bleeding: Secondary | ICD-10-CM | POA: Diagnosis present

## 2017-03-30 DIAGNOSIS — Z886 Allergy status to analgesic agent status: Secondary | ICD-10-CM | POA: Diagnosis not present

## 2017-03-30 DIAGNOSIS — Z79899 Other long term (current) drug therapy: Secondary | ICD-10-CM

## 2017-03-30 DIAGNOSIS — I10 Essential (primary) hypertension: Secondary | ICD-10-CM | POA: Diagnosis present

## 2017-03-30 DIAGNOSIS — I959 Hypotension, unspecified: Secondary | ICD-10-CM | POA: Diagnosis present

## 2017-03-30 DIAGNOSIS — Z6831 Body mass index (BMI) 31.0-31.9, adult: Secondary | ICD-10-CM | POA: Diagnosis not present

## 2017-03-30 DIAGNOSIS — E876 Hypokalemia: Secondary | ICD-10-CM | POA: Diagnosis present

## 2017-03-30 DIAGNOSIS — K219 Gastro-esophageal reflux disease without esophagitis: Secondary | ICD-10-CM | POA: Diagnosis present

## 2017-03-30 DIAGNOSIS — Z885 Allergy status to narcotic agent status: Secondary | ICD-10-CM

## 2017-03-30 DIAGNOSIS — K648 Other hemorrhoids: Secondary | ICD-10-CM | POA: Diagnosis present

## 2017-03-30 DIAGNOSIS — D62 Acute posthemorrhagic anemia: Secondary | ICD-10-CM | POA: Diagnosis present

## 2017-03-30 DIAGNOSIS — E861 Hypovolemia: Secondary | ICD-10-CM | POA: Diagnosis not present

## 2017-03-30 DIAGNOSIS — K922 Gastrointestinal hemorrhage, unspecified: Secondary | ICD-10-CM | POA: Diagnosis not present

## 2017-03-30 DIAGNOSIS — R195 Other fecal abnormalities: Secondary | ICD-10-CM

## 2017-03-30 DIAGNOSIS — R578 Other shock: Secondary | ICD-10-CM | POA: Diagnosis not present

## 2017-03-30 DIAGNOSIS — K5731 Diverticulosis of large intestine without perforation or abscess with bleeding: Secondary | ICD-10-CM | POA: Diagnosis not present

## 2017-03-30 DIAGNOSIS — K625 Hemorrhage of anus and rectum: Secondary | ICD-10-CM | POA: Diagnosis present

## 2017-03-30 DIAGNOSIS — E872 Acidosis: Secondary | ICD-10-CM | POA: Diagnosis present

## 2017-03-30 DIAGNOSIS — K921 Melena: Secondary | ICD-10-CM | POA: Diagnosis present

## 2017-03-30 LAB — FOLATE: Folate: 17.7 ng/mL (ref >5.9)

## 2017-03-30 LAB — RETICULOCYTES
RBC.: 3.7 MIL/uL — ABNORMAL LOW (ref 3.87–5.11)
RETIC COUNT ABSOLUTE: 51.8 10*3/uL (ref 19.0–186.0)
RETIC CT PCT: 1.4 % (ref 0.4–3.1)

## 2017-03-30 LAB — COMPREHENSIVE METABOLIC PANEL
ALBUMIN: 3.2 g/dL — AB (ref 3.5–5.0)
ALT: 7 U/L — ABNORMAL LOW (ref 14–54)
ANION GAP: 7 (ref 5–15)
AST: 17 U/L (ref 15–41)
Alkaline Phosphatase: 64 U/L (ref 38–126)
BILIRUBIN TOTAL: 0.3 mg/dL (ref 0.3–1.2)
BUN: 15 mg/dL (ref 6–20)
CHLORIDE: 108 mmol/L (ref 101–111)
CO2: 24 mmol/L (ref 22–32)
Calcium: 9.2 mg/dL (ref 8.9–10.3)
Creatinine, Ser: 1.14 mg/dL — ABNORMAL HIGH (ref 0.44–1.00)
GFR calc Af Amer: 52 mL/min — ABNORMAL LOW (ref >60)
GFR calc non Af Amer: 45 mL/min — ABNORMAL LOW (ref >60)
Glucose, Bld: 125 mg/dL — ABNORMAL HIGH (ref 65–99)
POTASSIUM: 4.5 mmol/L (ref 3.5–5.1)
Sodium: 139 mmol/L (ref 135–145)
TOTAL PROTEIN: 6.5 g/dL (ref 6.5–8.1)

## 2017-03-30 LAB — CBC
HEMATOCRIT: 31.2 % — AB (ref 36.0–46.0)
HEMOGLOBIN: 9.7 g/dL — AB (ref 12.0–15.0)
MCH: 26.5 pg (ref 26.0–34.0)
MCHC: 31.1 g/dL (ref 30.0–36.0)
MCV: 85.2 fL (ref 78.0–100.0)
Platelets: 295 10*3/uL (ref 150–400)
RBC: 3.66 MIL/uL — ABNORMAL LOW (ref 3.87–5.11)
RDW: 15.9 % — ABNORMAL HIGH (ref 11.5–15.5)
WBC: 10.9 10*3/uL — AB (ref 4.0–10.5)

## 2017-03-30 LAB — IRON AND TIBC
IRON: 32 ug/dL (ref 28–170)
Saturation Ratios: 13 % (ref 10.4–31.8)
TIBC: 248 ug/dL — AB (ref 250–450)
UIBC: 216 ug/dL

## 2017-03-30 LAB — FERRITIN: FERRITIN: 89 ng/mL (ref 11–307)

## 2017-03-30 LAB — VITAMIN B12: Vitamin B-12: 313 pg/mL (ref 180–914)

## 2017-03-30 MED ORDER — SODIUM CHLORIDE 0.9 % IV BOLUS (SEPSIS)
1000.0000 mL | Freq: Once | INTRAVENOUS | Status: AC
  Administered 2017-03-30: 1000 mL via INTRAVENOUS

## 2017-03-30 MED ORDER — SODIUM CHLORIDE 0.9 % IV SOLN
INTRAVENOUS | Status: DC
  Administered 2017-03-30: 22:00:00 via INTRAVENOUS

## 2017-03-30 NOTE — ED Notes (Signed)
Occult blood card done with MD, gross blood noted, MD instructed this RN no need to run test.

## 2017-03-30 NOTE — ED Notes (Signed)
Called pt for triage no answer 

## 2017-03-30 NOTE — H&P (Signed)
History and Physical    Carol Carlson:[id number] DOB: June 04, 1941 DOA: 03/30/2017  PCP: Patient, [name]  Patient coming from:  home  Chief Complaint:  Rectal bleeding  HPI: Carol Carlson is a 76 y.o. female with medical history significant of GERD, HTN previous h/o gib says hospitalized here about 2 years ago (cannot find in EPIC) and she had a scope here (cannot find in EPIC) comes in with one day of dark stools from rectum.  She previously said they were bright red then said they were darker maroonish with dark clots .  She had 4 different bloody bowel movements today.  No n/v.  Some lower abd cramping.  No fevers.  Rectal per dr Hyacinth Meeker was bright red blood on exam.  Pt referred for admission for anemia and gib.   Review of Systems: As per HPI otherwise 10 point review of systems negative.   Past Medical History:  Diagnosis Date  . Essential hypertension 08/12/2015  . GERD (gastroesophageal reflux disease) 08/12/2015  . Hypertension   . Seasonal allergies     Past Surgical History:  Procedure Laterality Date  . CHOLECYSTECTOMY N/A 08/11/2015   Procedure: LAPAROSCOPIC CHOLECYSTECTOMY;  Surgeon: Violeta Gelinas, MD;  Location: [facility] OR;  Service: General;  Laterality: N/A;     reports that she has never smoked. She has never used smokeless tobacco. She reports that she does not drink alcohol. Her drug history is not on file.  Allergies  Allergen Reactions  . Aspirin Other (See Comments)    Can only take    makes heart race  . Hydrocodone-Acetaminophen Nausea And Vomiting    No family history on file.  No premature CAD  Prior to Admission medications   Medication Sig Start Date End Date Taking? Authorizing Provider  acetaminophen (TYLENOL) 325 MG tablet Take 2 tablets (650 mg total) by mouth every 6 (six) hours as needed for mild pain, moderate pain, fever or headache. 08/12/15  Yes Sherrie George, PA-C  albuterol (PROVENTIL HFA;VENTOLIN HFA) 108 (90 BASE)  MCG/ACT inhaler Inhale 1-2 puffs into the lungs every 6 (six) hours as needed for wheezing or shortness of breath. 11/10/14  Yes Marisa Severin, MD  amLODipine-benazepril (LOTREL) 10-40 MG capsule Take 1 capsule by mouth daily.   Yes [provider]  Aspirin-Salicylamide-Caffeine (BC FAST PAIN RELIEF) 650-195-33.3 MG PACK Take 1 Package by mouth as needed (aching).   Yes [provider]  cetirizine (ZYRTEC) 10 MG tablet Take 10 mg by mouth daily as needed for allergies.   Yes [provider]  Chlorpheniramine-Acetaminophen (CORICIDIN HBP COLD/FLU PO) Take by mouth. One table spoon full   Yes [provider]  esomeprazole (NEXIUM) 20 MG capsule Take 20 mg by mouth daily as needed (acid reflux).   Yes [provider]  HYDROcodone-acetaminophen (NORCO/VICODIN) 5-325 MG tablet Take 1-2 tablets by mouth every 4 (four) hours as needed for moderate pain. Patient not taking: Reported on 03/30/2017 08/12/15   Sherrie George, PA-C    Physical Exam: Vitals:   03/30/17 1612 03/30/17 1615 03/30/17 1741  BP: 102/62  (!) 97/50  Pulse: (!) 119  (!) 107  Resp: 19  16  Temp: 97.9 F (36.6 C)    TempSrc: Oral    SpO2: 97%  100%  Weight:  78.5 kg (173 lb)   Height:   (1.6 m)       Constitutional: NAD, calm, comfortable Vitals:   03/30/17 1612 03/30/17 1615 03/30/17 1741  BP: 102/62  Marland Kitchen)  97/50  Pulse: (!) 119  (!) 107  Resp: 19  16  Temp: 97.9 F (36.6 C)    TempSrc: Oral    SpO2: 97%  100%  Weight:  78.5 kg (173 lb)   Height:   (1.6 m)    Eyes: PERRL, lids and conjunctivae normal ENMT: Mucous membranes are moist. Posterior pharynx clear of any exudate or lesions.Normal dentition.  Neck: normal, supple, no masses, no thyromegaly Respiratory: clear to auscultation bilaterally, no wheezing, no crackles. Normal respiratory effort. No accessory muscle use.  Cardiovascular: Regular rate and rhythm, no murmurs / rubs / gallops. No extremity edema.  2+ pedal pulses. No carotid bruits.  Abdomen: no tenderness, no masses palpated. No hepatosplenomegaly. Bowel sounds positive.  Musculoskeletal: no clubbing / cyanosis. No joint deformity upper and lower extremities. Good ROM, no contractures. Normal muscle tone.  Skin: no rashes, lesions, ulcers. No induration Neurologic: CN 2-12 grossly intact. Sensation intact, DTR normal. Strength 5/5 in all 4.  Psychiatric: Normal judgment and insight. Alert and oriented x 3. Normal mood.    Labs on Admission: I have personally reviewed following labs and imaging studies  CBC:  Recent Labs Lab 03/30/17 1630  WBC 10.9*  HGB 9.7*  HCT 31.2*  MCV 85.2  PLT 295   Basic Metabolic Panel:  Recent Labs Lab 03/30/17 1630  NA 139  K 4.5  CL 108  CO2 24  GLUCOSE 125*  BUN 15  CREATININE 1.14*  CALCIUM 9.2   GFR: Estimated Creatinine Clearance: 41 mL/min (A) (by C-G formula based on SCr of 1.14 mg/dL (H)). Liver Function Tests:  Recent Labs Lab 03/30/17 1630  AST 17  ALT 7*  ALKPHOS 64  BILITOT 0.3  PROT 6.5  ALBUMIN 3.2*    Radiological Exams on Admission: No results found.    Monitor cardiac sinus tachy in ED  No acute issues Old chart reviewed Case discussed with dr Hyacinth Meeker   Assessment/Plan 76 yo female with acute GIB  Principal Problem:   GIB (gastrointestinal bleeding)- bp soft.  Give ivf bolus.  Serial h/h q 8 hours.  edp dr Hyacinth Meeker to call GI for consult.  Keep npo in case needs scoping.  If hgb drops below 8 or becomes unstable transfuse.    Active Problems:   Acute blood loss anemia- as above, due to gi.  Gi consult.  Transfuse prn.   Essential hypertension- holding all bp meds   GERD (gastroesophageal reflux disease)- noted   Heme positive stool- as above   Hypotension- ivf     DVT prophylaxis:  scds Code Status:  full Family Communication:  Yes, children Disposition Plan:  Per day team Consults called:  GI Admission status:  admission     Iley Breeden A MD [facility]  If 7PM-7AM, please contact night-coverage [email or web address] Password [email or web address]  03/30/2017, 9:04 PM

## 2017-03-30 NOTE — ED Triage Notes (Signed)
Pt reports dark tarry stool starting at 2 pm today. Reports 4 episodes that were dark and had blood clots. Denies taking blood thinners. Reports dizziness and weakness.

## 2017-03-30 NOTE — ED Provider Notes (Signed)
[facility]-EMERGENCY DEPT Provider Note   CSN: [id number] Arrival date & time: 03/30/17  1546     History   Chief Complaint Chief Complaint  Patient presents with  . Rectal Bleeding    HPI Carol Carlson is a 76 y.o. female.  HPI  The patient is a 76 year old female with a history of hypertension, acid reflux as well as a history of cholecystitis about a year and a half ago. She has no history of gastrointestinal bleeding, she does report that she has had a colonoscopy but states that she never had any problems with it. She has been having some. Umbilical pain and over the last couple of days has had multiple bright red blood bowel movements per rectum. She denies losing consciousness but is becoming lightheaded. She does not feel short of breath, she does not have any urinary symptoms. She reports that she has very little formed stool but is mostly blood and clot that is coming out. She is assuming that this is related to hemorrhoids she was told that she had hemorrhoids at one time.  Past Medical History:  Diagnosis Date  . Essential hypertension 08/12/2015  . GERD (gastroesophageal reflux disease) 08/12/2015  . Hypertension   . Seasonal allergies     Patient Active Problem List   Diagnosis Date Noted  . Essential hypertension 08/12/2015  . GERD (gastroesophageal reflux disease) 08/12/2015  . Acute cholecystitis 08/10/2015    Past Surgical History:  Procedure Laterality Date  . CHOLECYSTECTOMY N/A 08/11/2015   Procedure: LAPAROSCOPIC CHOLECYSTECTOMY;  Surgeon: Violeta Gelinas, MD;  Location: [facility] OR;  Service: General;  Laterality: N/A;    OB History    No data available       Home Medications    Prior to Admission medications   Medication Sig Start Date End Date Taking? Authorizing Provider  acetaminophen (TYLENOL) 325 MG tablet Take 2 tablets (650 mg total) by mouth every 6 (six) hours as needed for mild pain, moderate pain, fever or headache. 08/12/15   Sherrie George,  PA-C  albuterol (PROVENTIL HFA;VENTOLIN HFA) 108 (90 BASE) MCG/ACT inhaler Inhale 1-2 puffs into the lungs every 6 (six) hours as needed for wheezing or shortness of breath. 11/10/14   Marisa Severin, MD  amLODipine-benazepril (LOTREL) 10-40 MG capsule Take 1 capsule by mouth daily.    [provider]  cetirizine (ZYRTEC) 10 MG tablet Take 10 mg by mouth daily as needed for allergies.    [provider]  esomeprazole (NEXIUM) 20 MG capsule Take 20 mg by mouth daily as needed (acid reflux).    [provider]  HYDROcodone-acetaminophen (NORCO/VICODIN) 5-325 MG tablet Take 1-2 tablets by mouth every 4 (four) hours as needed for moderate pain. 08/12/15   Sherrie George, PA-C    Family History No family history on file.  Social History Social History  Substance Use Topics  . Smoking status: Never Smoker  . Smokeless tobacco: Never Used  . Alcohol use No     Allergies   Aspirin   Review of Systems Review of Systems  All other systems reviewed and are negative.    Physical Exam Updated Vital Signs BP (!) 97/50 (BP Location: Left Arm)   Pulse (!) 107   Temp 97.9 F (36.6 C) (Oral)   Resp 16   Ht  (1.6 m)   Wt 78.5 kg (173 lb)   SpO2 100%   BMI 30.65 kg/m   Physical Exam  Constitutional: She appears well-developed and well-nourished. No distress.  HENT:  Head: Normocephalic and atraumatic.  Mouth/Throat: Oropharynx is clear and moist. No oropharyngeal exudate.  Eyes: Pupils are equal, round, and reactive to light. Conjunctivae and EOM are normal. Right eye exhibits no discharge. Left eye exhibits no discharge. No scleral icterus.  Neck: Normal range of motion. Neck supple. No JVD present. No thyromegaly present.  Cardiovascular: Normal rate, regular rhythm, normal heart sounds and intact distal pulses.  Exam reveals no gallop and no friction rub.   No murmur heard. Pulmonary/Chest: Effort normal and breath sounds normal. No respiratory  distress. She has no wheezes. She has no rales.  Abdominal: Soft. Bowel sounds are normal. She exhibits no distension and no mass. There is tenderness ( ttp around the periumbilical region, no guarding, no other ttp).  Genitourinary:  Genitourinary Comments: Chaperone present for exam, no external hemorrhoids or fissures, bright red blood present in the rectal vault without any masses or significant tenderness. No internal hemorrhoids palpated  Musculoskeletal: Normal range of motion. She exhibits no edema or tenderness.  No edema  Lymphadenopathy:    She has no cervical adenopathy.  Neurological: She is alert. Coordination normal.  Skin: Skin is warm and dry. No rash noted. No erythema.  Psychiatric: She has a normal mood and affect. Her behavior is normal.  Nursing note and vitals reviewed.    ED Treatments / Results  Labs (all labs ordered are listed, but only abnormal results are displayed) Labs Reviewed  COMPREHENSIVE METABOLIC PANEL - Abnormal; Notable for the following:       Result Value   Glucose, Bld 125 (*)    Creatinine, Ser 1.14 (*)    Albumin 3.2 (*)    ALT 7 (*)    GFR calc non Af Amer 45 (*)    GFR calc Af Amer 52 (*)    All other components within normal limits  CBC - Abnormal; Notable for the following:    WBC 10.9 (*)    RBC 3.66 (*)    Hemoglobin 9.7 (*)    HCT 31.2 (*)    RDW 15.9 (*)    All other components within normal limits  TYPE AND SCREEN     Radiology No results found.  Procedures Procedures (including critical care time)  Medications Ordered in ED Medications  0.9 %  sodium chloride infusion (not administered)     Initial Impression / Assessment and Plan / ED Course  I have reviewed the triage vital signs and the nursing notes.  Pertinent labs & imaging results that were available during my care of the patient were reviewed by me and considered in my medical decision making (see chart for details).    The patient has been having  bright red blood per rectum, on exam she has this as well, hemodynamically the patient is not hypotensive nor is she tachycardic but her hemoglobin does seem to be about 2 g lower than last checked last year. This could represent an acute drop. Her BUN was 15, the color of the stool suggest that this is a lower GI or rectal bleed. We will admit to the hospital for observation. The patient is not anticoagulated  Discussed care with the hospitalist Dr. Onalee Hua, she will admit the patient to the hospital. Blood pressure is slightly low, heart rate is slightly high, IV fluids have been started. I did screen ordered.  Final Clinical Impressions(s) / ED Diagnoses   Final diagnoses:  Rectal bleeding    New Prescriptions New Prescriptions   No  medications on file     Eber Hong, MD 03/30/17 Windell Moment

## 2017-03-31 DIAGNOSIS — K921 Melena: Secondary | ICD-10-CM

## 2017-03-31 LAB — BASIC METABOLIC PANEL
Anion gap: 4 — ABNORMAL LOW (ref 5–15)
BUN: 10 mg/dL (ref 6–20)
CO2: 24 mmol/L (ref 22–32)
Calcium: 8.1 mg/dL — ABNORMAL LOW (ref 8.9–10.3)
Chloride: 113 mmol/L — ABNORMAL HIGH (ref 101–111)
Creatinine, Ser: 0.89 mg/dL (ref 0.44–1.00)
GFR calc Af Amer: >60 mL/min (ref >60)
GLUCOSE: 91 mg/dL (ref 65–99)
POTASSIUM: 3.9 mmol/L (ref 3.5–5.1)
SODIUM: 141 mmol/L (ref 135–145)

## 2017-03-31 LAB — HEMOGLOBIN AND HEMATOCRIT, BLOOD
HCT: 24.3 % — ABNORMAL LOW (ref 36.0–46.0)
HEMATOCRIT: 28.4 % — AB (ref 36.0–46.0)
HEMATOCRIT: 25.7 % — AB (ref 36.0–46.0)
HEMOGLOBIN: 8 g/dL — AB (ref 12.0–15.0)
Hemoglobin: 8.9 g/dL — ABNORMAL LOW (ref 12.0–15.0)
Hemoglobin: 7.6 g/dL — ABNORMAL LOW (ref 12.0–15.0)

## 2017-03-31 LAB — CBC
HEMATOCRIT: 24.1 % — AB (ref 36.0–46.0)
Hemoglobin: 7.5 g/dL — ABNORMAL LOW (ref 12.0–15.0)
MCH: 26.4 pg (ref 26.0–34.0)
MCHC: 31.1 g/dL (ref 30.0–36.0)
MCV: 84.9 fL (ref 78.0–100.0)
Platelets: 215 10*3/uL (ref 150–400)
RBC: 2.84 MIL/uL — ABNORMAL LOW (ref 3.87–5.11)
RDW: 15.9 % — AB (ref 11.5–15.5)
WBC: 7.1 10*3/uL (ref 4.0–10.5)

## 2017-03-31 LAB — MRSA PCR SCREENING: MRSA BY PCR: NEGATIVE

## 2017-03-31 MED ORDER — ACETAMINOPHEN 650 MG RE SUPP: 650.0000 mg | Freq: Four times a day (QID) | RECTAL | Status: DC | PRN

## 2017-03-31 MED ORDER — PEG 3350-KCL-NA BICARB-NACL 420 G PO SOLR
4000.0000 mL | Freq: Once | ORAL | Status: AC
  Administered 2017-03-31: 4000 mL via ORAL
  Filled 2017-03-31: qty 4000

## 2017-03-31 MED ORDER — ONDANSETRON HCL 4 MG PO TABS: 4.0000 mg | ORAL_TABLET | Freq: Four times a day (QID) | ORAL | Status: DC | PRN

## 2017-03-31 MED ORDER — ALBUTEROL SULFATE (2.5 MG/3ML) 0.083% IN NEBU: 3.0000 mL | INHALATION_SOLUTION | Freq: Four times a day (QID) | RESPIRATORY_TRACT | Status: DC | PRN

## 2017-03-31 MED ORDER — PANTOPRAZOLE SODIUM 40 MG PO TBEC: 40.0000 mg | DELAYED_RELEASE_TABLET | Freq: Every day | ORAL | Status: DC

## 2017-03-31 MED ORDER — SODIUM CHLORIDE 0.9 % IV SOLN
INTRAVENOUS | Status: DC
  Administered 2017-03-31 – 2017-04-02 (×6): via INTRAVENOUS

## 2017-03-31 MED ORDER — ONDANSETRON HCL 4 MG/2ML IJ SOLN
4.0000 mg | Freq: Four times a day (QID) | INTRAMUSCULAR | Status: DC | PRN
  Administered 2017-04-01: 4 mg via INTRAVENOUS
  Filled 2017-03-31: qty 2

## 2017-03-31 MED ORDER — PANTOPRAZOLE SODIUM 40 MG IV SOLR
40.0000 mg | Freq: Two times a day (BID) | INTRAVENOUS | Status: DC
  Administered 2017-03-31 – 2017-04-02 (×5): 40 mg via INTRAVENOUS
  Filled 2017-03-31 (×5): qty 40

## 2017-03-31 MED ORDER — ACETAMINOPHEN 325 MG PO TABS
650.0000 mg | ORAL_TABLET | Freq: Four times a day (QID) | ORAL | Status: DC | PRN
  Administered 2017-04-03 – 2017-04-04 (×2): 650 mg via ORAL
  Filled 2017-03-31 (×2): qty 2

## 2017-03-31 NOTE — Consult Note (Signed)
Subjective:   HPI  The patient is a 76 year old female who was admitted to the hospital because of rectal bleeding. She started to experience rectal bleeding yesterday at about 2 PM and states her last episode of rectal bleeding was also yesterday. She was feeling dizzy. She came to the hospital and was admitted. She has not had any bleeding today.she has a history of diverticulosis. Her last colonoscopy was in 2011. She has a history of a large hiatal hernia. She denies hematemesis.  Review of Systems No chest pain or shortness of breath   Past Medical History:  Diagnosis Date  . Essential hypertension 08/12/2015  . GERD (gastroesophageal reflux disease) 08/12/2015  . Hypertension   . Seasonal allergies    Past Surgical History:  Procedure Laterality Date  . CHOLECYSTECTOMY N/A 08/11/2015   Procedure: LAPAROSCOPIC CHOLECYSTECTOMY;  Surgeon: Violeta Gelinas, MD;  Location: [facility] OR;  Service: General;  Laterality: N/A;   Social History   Social History  . Marital status: Widowed    Spouse name: N/A  . Number of children: N/A  . Years of education: N/A   Occupational History  . Not on file.   Social History Main Topics  . Smoking status: Never Smoker  . Smokeless tobacco: Never Used  . Alcohol use No  . Drug use: Unknown  . Sexual activity: Not on file   Other Topics Concern  . Not on file   Social History Narrative  . No narrative on file   family history is not on file.  Current Facility-Administered Medications:  .  0.9 %  sodium chloride infusion, , Intravenous, Continuous, Tarry Kos A, MD, Last Rate: 150 mL/hr at 03/31/17 0846 .  acetaminophen (TYLENOL) tablet 650 mg, 650 mg, Oral, Q6H PRN **OR** acetaminophen (TYLENOL) suppository 650 mg, 650 mg, Rectal, Q6H PRN, Tarry Kos A, MD .  albuterol (PROVENTIL) (2.5 MG/3ML) 0.083% nebulizer solution 3 mL, 3 mL, Inhalation, Q6H PRN, Tarry Kos A, MD .  ondansetron (ZOFRAN) tablet 4 mg, 4 mg, Oral, Q6H PRN **OR**  ondansetron (ZOFRAN) injection 4 mg, 4 mg, Intravenous, Q6H PRN, Tarry Kos A, MD .  pantoprazole (PROTONIX) injection 40 mg, 40 mg, Intravenous, Q12H, Drema Dallas, MD, 40 mg at 03/31/17 1013 Allergies  Allergen Reactions  . Aspirin Other (See Comments)    Can only take    makes heart race  . Hydrocodone-Acetaminophen Nausea And Vomiting     Objective:     BP 138/73   Pulse (!) 107   Temp 98.1 F (36.7 C) (Oral)   Resp 19   Ht  (1.6 m)   Wt 80.3 kg (177 lb 0.5 oz)   SpO2 98%   BMI 31.36 kg/m   She is in no distress  Alert  Heart regular rhythm no murmurs  Lungs clear  Abdomen: Bowel sounds present, soft, nontender  Laboratory No components found for: D1    Assessment:     Lower gastrointestinal bleeding most likely secondary to diverticulosis. Clinically this appears to have stopped.  Anemia secondary to blood loss.      Plan:     We will set patient up for colonoscopy tomorrow to further evaluate. I do not think there is any evidence of upper gastrointestinal bleeding. Her BUN is normal . Lab Results  Component Value Date   HGB 7.5 (L) 03/31/2017   HGB 7.6 (L) 03/31/2017   HGB 8.9 (L) 03/31/2017   HCT 24.1 (L) 03/31/2017   HCT 24.3 (L) 03/31/2017  HCT 28.4 (L) 03/31/2017   ALKPHOS 64 03/30/2017   ALKPHOS 113 08/11/2015   ALKPHOS 91 08/09/2015   AST 17 03/30/2017   AST 229 (H) 08/11/2015   AST 212 (H) 08/09/2015   ALT 7 (L) 03/30/2017   ALT 283 (H) 08/11/2015   ALT 107 (H) 08/09/2015

## 2017-03-31 NOTE — Progress Notes (Signed)
PROGRESS NOTE    Carol Carlson  QMV:[id number] DOB: 1940/09/03 DOA: 03/30/2017 PCP: Patient, [name]   Brief Narrative:  76 y.o. BF PMHx GERD, HTN, Hx GI bleed hospitalized ~ 2 years ago  (cannot find in EPIC) and she had a scope here (cannot find in EPIC).  Presents one day of dark stools from rectum.  She previously said they were bright red then said they were darker maroonish with dark clots .  She had 4 different bloody bowel movements today.  No n/v.  Some lower abd cramping.  No fevers.  Rectal per Dr. Hyacinth Meeker was bright red blood on exam.  Pt referred for admission for anemia and gib.    Subjective: 10/11 A/O 4, negative CP, negative SOB, negative abdominal pain, positive cramping (just started GI prep), negative lightheadedness. States had previous colonoscopy approximately 5 years ago Dr. Dulce Sellar.   Assessment & Plan:   Principal Problem:   GIB (gastrointestinal bleeding) Active Problems:   Essential hypertension   GERD (gastroesophageal reflux disease)   Acute blood loss anemia   Heme positive stool   Hypotension   Acute blood loss anemia/GI bleed -Trending H/H   Recent Labs Lab 03/30/17 1630 03/31/17 0100 03/31/17 0651  HGB 9.7* 8.9* 7.5*  7.6*  -Protonix IV BID  -Transfuse for hemoglobin<7 -H/H TID -Discussed case with PA Jennye Moccasin  GI   Essential HTN/Hypotension -Hold all BP medication secondary GI bleed -BP stabilized decrease normal saline 30ml/hr            DVT prophylaxis: SCD Code Status: full Family Communication: none Disposition Plan: TBD   Consultants:  [facility]    Procedures/Significant Events:  none   I have personally reviewed and interpreted all radiology studies and my findings are as above.  VENTILATOR SETTINGS: none   Cultures none  Antimicrobials: none   Devices None   LINES / TUBES:  None    Continuous Infusions: . sodium chloride 150 mL/hr at 03/31/17 0100      Objective: Vitals:   03/31/17 0000 03/31/17 0335 03/31/17 0400 03/31/17 0800  BP: 122/65  (!) 113/58 (!) 134/58  Pulse: 75  94   Resp: 14  15   Temp:  98.2 F (36.8 C) 98.2 F (36.8 C) 98.1 F (36.7 C)  TempSrc:  Oral Oral Oral  SpO2:      Weight: 177 lb 0.5 oz (80.3 kg)     Height:        Intake/Output Summary (Last 24 hours) at 03/31/17 0830 Last data filed at 03/31/17 0815  Gross per 24 hour  Intake                0 ml  Output              725 ml  Net             -725 ml   Filed Weights   03/30/17 1615 03/31/17 0000  Weight: 173 lb (78.5 kg) 177 lb 0.5 oz (80.3 kg)    Examination:  General: A/O 4,No acute respiratory distress Neck:  Negative scars, masses, torticollis, lymphadenopathy, JVD Lungs: Clear to auscultation bilaterally without wheezes or crackles Cardiovascular: Regular rate and rhythm without murmur gallop or rub normal S1 and S2 Abdomen: negative abdominal pain, nondistended, positive soft, bowel sounds, no rebound, no ascites, no appreciable mass Extremities: No significant cyanosis, clubbing, or edema bilateral lower extremities Skin: Negative rashes, lesions, ulcers Psychiatric:  Negative depression, negative anxiety, negative fatigue,  negative mania  Central nervous system:  Cranial nerves II through XII intact, tongue/uvula midline, all extremities muscle strength 5/5, sensation intact throughout,  negative dysarthria, negative expressive aphasia, negative receptive aphasia.  .     Data Reviewed: Care during the described time interval was provided by me .  I have reviewed this patient's available data, including medical history, events of note, physical examination, and all test results as part of my evaluation.   CBC:  Recent Labs Lab 03/30/17 1630 03/31/17 0100 03/31/17 0651  WBC 10.9*  --  7.1  HGB 9.7* 8.9* 7.5*  7.6*  HCT 31.2* 28.4* 24.1*  24.3*  MCV 85.2  --  84.9  PLT 295  --  215   Basic Metabolic Panel:  Recent  Labs Lab 03/30/17 1630 03/31/17 0651  NA 139 141  K 4.5 3.9  CL 108 113*  CO2 24 24  GLUCOSE 125* 91  BUN 15 10  CREATININE 1.14* 0.89  CALCIUM 9.2 8.1*   GFR: Estimated Creatinine Clearance: 53.1 mL/min (by C-G formula based on SCr of 0.89 mg/dL). Liver Function Tests:  Recent Labs Lab 03/30/17 1630  AST 17  ALT 7*  ALKPHOS 64  BILITOT 0.3  PROT 6.5  ALBUMIN 3.2*   No results for input(s): LIPASE, AMYLASE in the last 168 hours. No results for input(s): AMMONIA in the last 168 hours. Coagulation Profile: No results for input(s): INR, PROTIME in the last 168 hours. Cardiac Enzymes: No results for input(s): CKTOTAL, CKMB, CKMBINDEX, TROPONINI in the last 168 hours. BNP (last 3 results) No results for input(s): PROBNP in the last 8760 hours. HbA1C: No results for input(s): HGBA1C in the last 72 hours. CBG: No results for input(s): GLUCAP in the last 168 hours. Lipid Profile: No results for input(s): CHOL, HDL, LDLCALC, TRIG, CHOLHDL, LDLDIRECT in the last 72 hours. Thyroid Function Tests: No results for input(s): TSH, T4TOTAL, FREET4, T3FREE, THYROIDAB in the last 72 hours. Anemia Panel:  Recent Labs  03/30/17 2223  VITAMINB12 313  FOLATE 17.7  FERRITIN 89  TIBC 248*  IRON 32  RETICCTPCT 1.4   Urine analysis:    Component Value Date/Time   COLORURINE YELLOW 08/09/2015 2357   APPEARANCEUR HAZY (A) 08/09/2015 2357   LABSPEC 1.018 08/09/2015 2357   PHURINE 7.0 08/09/2015 2357   GLUCOSEU 100 (A) 08/09/2015 2357   HGBUR NEGATIVE 08/09/2015 2357   BILIRUBINUR NEGATIVE 08/09/2015 2357   KETONESUR NEGATIVE 08/09/2015 2357   PROTEINUR NEGATIVE 08/09/2015 2357   UROBILINOGEN 0.2 03/21/2010 0553   NITRITE NEGATIVE 08/09/2015 2357   LEUKOCYTESUR NEGATIVE 08/09/2015 2357   Sepsis Labs: (procalcitonin:4,lacticidven:4)  ) Recent Results (from the past 240 hour(s))  MRSA PCR Screening     Status: None   Collection Time: 03/30/17 11:29 PM  Result  Value Ref Range Status   MRSA by PCR NEGATIVE NEGATIVE Final    Comment:        The GeneXpert MRSA Assay (FDA approved for NASAL specimens only), is one component of a comprehensive MRSA colonization surveillance program. It is not intended to diagnose MRSA infection nor to guide or monitor treatment for MRSA infections.          Radiology Studies: No results found.      Scheduled Meds: . pantoprazole  40 mg Oral Daily   Continuous Infusions: . sodium chloride 150 mL/hr at 03/31/17 0100     LOS: 1 day    Time spent: 40 minutes    WOODS, Roselind Messier, MD  [facility] Pager [phone]   If 7PM-7AM, please contact night-coverage [email or web address] Password [email or web address] 03/31/2017, 8:30 AM

## 2017-03-31 NOTE — Progress Notes (Signed)
Text page to DR Joseph Art to make aware of Hgb/Hct less than 8

## 2017-04-01 ENCOUNTER — Encounter (HOSPITAL_COMMUNITY)
Admission: EM | Disposition: A | Discharge status: Home / Self Care | Payer: Self-pay | Source: Home / Self Care | Attending: Internal Medicine

## 2017-04-01 ENCOUNTER — Inpatient Hospital Stay (HOSPITAL_COMMUNITY): Payer: Medicare Other | Admitting: Anesthesiology

## 2017-04-01 ENCOUNTER — Encounter (HOSPITAL_COMMUNITY): Payer: Self-pay | Admitting: Certified Registered Nurse Anesthetist

## 2017-04-01 DIAGNOSIS — K625 Hemorrhage of anus and rectum: Secondary | ICD-10-CM

## 2017-04-01 DIAGNOSIS — E876 Hypokalemia: Secondary | ICD-10-CM

## 2017-04-01 DIAGNOSIS — D62 Acute posthemorrhagic anemia: Secondary | ICD-10-CM

## 2017-04-01 DIAGNOSIS — K922 Gastrointestinal hemorrhage, unspecified: Secondary | ICD-10-CM

## 2017-04-01 HISTORY — PX: COLONOSCOPY WITH PROPOFOL: SHX5780

## 2017-04-01 LAB — BASIC METABOLIC PANEL
ANION GAP: 3 — AB (ref 5–15)
BUN: 5 mg/dL — ABNORMAL LOW (ref 6–20)
CHLORIDE: 125 mmol/L — AB (ref 101–111)
CO2: 15 mmol/L — AB (ref 22–32)
Calcium: 5.8 mg/dL — CL (ref 8.9–10.3)
Creatinine, Ser: 0.56 mg/dL (ref 0.44–1.00)
GFR calc Af Amer: >60 mL/min (ref >60)
GLUCOSE: 115 mg/dL — AB (ref 65–99)
POTASSIUM: 2.5 mmol/L — AB (ref 3.5–5.1)
Sodium: 143 mmol/L (ref 135–145)

## 2017-04-01 LAB — HEMOGLOBIN AND HEMATOCRIT, BLOOD
HCT: 20.1 % — ABNORMAL LOW (ref 36.0–46.0)
Hemoglobin: 6.3 g/dL — CL (ref 12.0–15.0)

## 2017-04-01 LAB — CBC
HCT: 18.7 % — ABNORMAL LOW (ref 36.0–46.0)
HEMOGLOBIN: 5.9 g/dL — AB (ref 12.0–15.0)
MCH: 26.5 pg (ref 26.0–34.0)
MCHC: 31.6 g/dL (ref 30.0–36.0)
MCV: 83.9 fL (ref 78.0–100.0)
Platelets: 128 10*3/uL — ABNORMAL LOW (ref 150–400)
RBC: 2.23 MIL/uL — AB (ref 3.87–5.11)
RDW: 15.3 % (ref 11.5–15.5)
WBC: 7.7 10*3/uL (ref 4.0–10.5)

## 2017-04-01 LAB — MAGNESIUM: Magnesium: 1.2 mg/dL — ABNORMAL LOW (ref 1.7–2.4)

## 2017-04-01 LAB — PREPARE RBC (CROSSMATCH)

## 2017-04-01 LAB — PROTIME-INR
INR: 1.29
Prothrombin Time: 16 seconds — ABNORMAL HIGH (ref 11.4–15.2)

## 2017-04-01 SURGERY — COLONOSCOPY WITH PROPOFOL
Anesthesia: Monitor Anesthesia Care

## 2017-04-01 MED ORDER — SODIUM CHLORIDE 0.9 % IV SOLN: INTRAVENOUS | Status: DC

## 2017-04-01 MED ORDER — SODIUM CHLORIDE 0.9 % IV SOLN
Freq: Once | INTRAVENOUS | Status: AC
  Administered 2017-04-01: 02:00:00 via INTRAVENOUS

## 2017-04-01 MED ORDER — LIDOCAINE HCL (CARDIAC) 20 MG/ML IV SOLN
INTRAVENOUS | Status: DC | PRN
  Administered 2017-04-01: 50 mg via INTRATRACHEAL

## 2017-04-01 MED ORDER — PROPOFOL 500 MG/50ML IV EMUL
INTRAVENOUS | Status: DC | PRN
  Administered 2017-04-01: 65 ug/kg/min via INTRAVENOUS

## 2017-04-01 MED ORDER — PROPOFOL 10 MG/ML IV BOLUS
INTRAVENOUS | Status: DC | PRN
  Administered 2017-04-01: 20 mg via INTRAVENOUS

## 2017-04-01 MED ORDER — LACTATED RINGERS IV SOLN
INTRAVENOUS | Status: DC | PRN
  Administered 2017-04-01: 08:00:00 via INTRAVENOUS

## 2017-04-01 MED ORDER — POTASSIUM CHLORIDE CRYS ER 20 MEQ PO TBCR
40.0000 meq | EXTENDED_RELEASE_TABLET | Freq: Four times a day (QID) | ORAL | Status: DC
  Administered 2017-04-01 (×3): 40 meq via ORAL
  Filled 2017-04-01 (×3): qty 2

## 2017-04-01 MED ORDER — SODIUM CHLORIDE 0.9 % IV SOLN: Freq: Once | INTRAVENOUS | Status: DC

## 2017-04-01 MED ORDER — POTASSIUM CHLORIDE CRYS ER 20 MEQ PO TBCR: 40.0000 meq | EXTENDED_RELEASE_TABLET | Freq: Three times a day (TID) | ORAL | Status: DC

## 2017-04-01 MED ORDER — MAGNESIUM SULFATE 4 GM/100ML IV SOLN
4.0000 g | Freq: Once | INTRAVENOUS | Status: AC
  Administered 2017-04-01: 4 g via INTRAVENOUS
  Filled 2017-04-01: qty 100

## 2017-04-01 SURGICAL SUPPLY — 22 items

## 2017-04-01 NOTE — Progress Notes (Signed)
H&H 5.9, MD notified

## 2017-04-01 NOTE — Progress Notes (Signed)
CRITICAL VALUE ALERT  Critical Value:  Hemoglobin 6.3  Date & Time Notied: 04/01/17 0110  Provider Notified: Bruna Potter  Orders Received/Actions taken: Transfusion Orders Received

## 2017-04-01 NOTE — Progress Notes (Signed)
Carol Carlson is a 76 y.o. female has presented with rectal bleeding   The various methods of treatment have been discussed with the patient and family. After consideration of risks, benefits and other options for treatment, the patient has consented to  Procedure(s): Colonoscopy  as a surgical intervention .  The patient's history has been reviewed, patient examined, no change in status, stable for surgery.  I have reviewed the patient's chart and labs.  Questions were answered to the patient's satisfaction.

## 2017-04-01 NOTE — Progress Notes (Signed)
[place] TEAM 1 - Stepdown/ICU TEAM  Carol Carlson  RUE:[id number] DOB: Mar 21, 1941 DOA: 03/30/2017 PCP: Patient, [name]    Brief Narrative:  76 y.o.F w/ a Hx of GERD, HTN, and a GIB requiring hospitalization ~ 2 years ago who presented w/ a one day hx of dark stools from rectum. In the ED her rectal exam (per Dr. Hyacinth Meeker) noted bright red blood.  Subjective: The patient is alert oriented and quite pleasant.  She denies chest pain shortness breath fevers chills nausea vomiting or abdominal pain.  She denies lightheadedness or dizziness but does report that she feels tired in general.  Assessment & Plan:  Acute blood loss anemia due to GI bleeding  Diverticular source suspected - transfuse as needed to keep hemoglobin at 7.0 or greater - if recurrent bleeding encountered nuclear medicine bleeding scan will be our next step  Hypotension with a history of HTN - hemorrhagic shock Due to significant blood loss/hypovolemia - improved with transfusion and volume expansion - follow trend  Severe hypokalemia Likely due to poor intake - supplement magnesium first then replace potassium  Metabolic acidosis likely lactic acidosis due to hypoperfusion in setting of hemorrhagic shock - blood pressure much improved - follow trend  Severe hypomagnesemia Likely due to poor intake - supplement via IV and follow  DVT prophylaxis: SCDs Code Status: FULL CODE Family Communication: spoke w/ son at bedside  Disposition Plan: SDU  Consultants:  [facility]  Procedures: Colo 10/12 - normal ileum - blood in descending colon transverse colon and ascending colon - extensive diverticulosis  Antimicrobials:  none  Objective: Blood pressure 112/64, pulse (!) 102, temperature (!) 97.4 F (36.3 C), temperature source Oral, resp. rate 15, height  (1.6 m), weight 80.3 kg (177 lb 0.5 oz), SpO2 99 %.  Intake/Output Summary (Last 24 hours) at 04/01/17 1405 Last data filed at 04/01/17 1126  Gross  per 24 hour  Intake           3852.5 ml  Output              750 ml  Net           3102.5 ml   Filed Weights   03/30/17 1615 03/31/17 0000  Weight: 78.5 kg (173 lb) 80.3 kg (177 lb 0.5 oz)    Examination: General: No acute respiratory distress Lungs: Clear to auscultation bilaterally without wheezes or crackles Cardiovascular: Regular rate and rhythm without murmur gallop or rub normal S1 and S2 Abdomen: Nontender, nondistended, soft, bowel sounds positive, no rebound, no ascites, no appreciable mass Extremities: No significant cyanosis, clubbing, or edema bilateral lower extremities  CBC:  Recent Labs Lab 03/30/17 1630 03/31/17 0100 03/31/17 0651 03/31/17 1723 04/01/17 0042 04/01/17 1123  WBC 10.9*  --  7.1  --   --  7.7  HGB 9.7* 8.9* 7.5*  7.6* 8.0* 6.3* 5.9*  HCT 31.2* 28.4* 24.1*  24.3* 25.7* 20.1* 18.7*  MCV 85.2  --  84.9  --   --  83.9  PLT 295  --  215  --   --  128*   Basic Metabolic Panel:  Recent Labs Lab 03/30/17 1630 03/31/17 0651 04/01/17 1123  NA 139 141 143  K 4.5 3.9 2.5*  CL 108 113* 125*  CO2 24 24 15*  GLUCOSE 125* 91 115*  BUN 15 10 5*  CREATININE 1.14* 0.89 0.56  CALCIUM 9.2 8.1* 5.8*  MG  --   --  1.2*  GFR: Estimated Creatinine Clearance: 59.1 mL/min (by C-G formula based on SCr of 0.56 mg/dL).  Liver Function Tests:  Recent Labs Lab 03/30/17 1630  AST 17  ALT 7*  ALKPHOS 64  BILITOT 0.3  PROT 6.5  ALBUMIN 3.2*    Coagulation Profile:  Recent Labs Lab 04/01/17 1123  INR 1.29    Recent Results (from the past 240 hour(s))  MRSA PCR Screening     Status: None   Collection Time: 03/30/17 11:29 PM  Result Value Ref Range Status   MRSA by PCR NEGATIVE NEGATIVE Final    Comment:        The GeneXpert MRSA Assay (FDA approved for NASAL specimens only), is one component of a comprehensive MRSA colonization surveillance program. It is not intended to diagnose MRSA infection nor to guide or monitor treatment  for MRSA infections.      Scheduled Meds: . pantoprazole (PROTONIX) IV  40 mg Intravenous Q12H     LOS: 2 days   Lonia Blood, MD [facility] Office  [phone] Pager - Text Page per Amion as per below:  On-Call/Text Page:      [email or web address]      password [email or web address]  If 7PM-7AM, please contact night-coverage [email or web address] Password [email or web address] 04/01/2017, 2:05 PM

## 2017-04-01 NOTE — Transfer of Care (Signed)
Immediate Anesthesia Transfer of Care Note  Patient: Carol Carlson  Procedure(s) Performed: COLONOSCOPY WITH PROPOFOL (N/A )  Patient Location: PACU  Anesthesia Type:MAC  Level of Consciousness: awake and alert   Airway & Oxygen Therapy: Patient Spontanous Breathing  Post-op Assessment: Report given to RN and Post -op Vital signs reviewed and stable  Post vital signs: Reviewed and stable  Last Vitals:  Vitals:   04/01/17 0735 04/01/17 0750  BP: (!) 152/59 (!) 142/50  Pulse: 95 87  Resp: 17 17  Temp: 36.9 C 36.8 C  SpO2: 100% 100%    Last Pain:  Vitals:   04/01/17 0750  TempSrc: Oral  PainSc:          Complications: No apparent anesthesia complications

## 2017-04-01 NOTE — Care Management Note (Signed)
Case Management Note  Patient Details  Name: Carol Carlson MRN: [id number] Date of Birth: April 23, 1941  Subjective/Objective:    From home with her sone Carol Carlson, pta she is indep, presents with GIB. She has a PCP , Dr. Claudie Revering, she has medication coverage.                Action/Plan: NCM will follow for dc needs.  Expected Discharge Date:                  Expected Discharge Plan:     In-House Referral:     Discharge planning Services  CM Consult  Post Acute Care Choice:    Choice offered to:     DME Arranged:    DME Agency:     HH Arranged:    HH Agency:     Status of Service:  In process, will continue to follow  If discussed at Long Length of Stay Meetings, dates discussed:    Additional Comments:  Leone Haven, RN 04/01/2017, 5:39 PM

## 2017-04-01 NOTE — Anesthesia Preprocedure Evaluation (Addendum)
Anesthesia Evaluation  Patient identified by MRN, date of birth, ID band Patient awake    Reviewed: Allergy & Precautions, NPO status , Patient's Chart, lab work & pertinent test results  Airway Mallampati: II  TM Distance: >3 FB Neck ROM: Full    Dental  (+) Partial Lower, Dental Advisory Given, Poor Dentition   Pulmonary neg pulmonary ROS,    breath sounds clear to auscultation       Cardiovascular hypertension, Pt. on medications  Rhythm:Regular Rate:Normal     Neuro/Psych negative neurological ROS  negative psych ROS   GI/Hepatic Neg liver ROS, GERD  ,  Endo/Other  Morbid obesity  Renal/GU negative Renal ROS     Musculoskeletal   Abdominal (+) + obese,   Peds  Hematology   Anesthesia Other Findings   Reproductive/Obstetrics                            BP Readings from Last 3 Encounters:  04/01/17 (!) 131/59  01/08/16 149/72  08/12/15 133/76   CBC    Component Value Date/Time   WBC 7.1 03/31/2017 0651   RBC 2.84 (L) 03/31/2017 0651   HGB 6.3 (LL) 04/01/2017 0042   HCT 20.1 (L) 04/01/2017 0042   PLT 215 03/31/2017 0651   MCV 84.9 03/31/2017 0651   MCH 26.4 03/31/2017 0651   MCHC 31.1 03/31/2017 0651   RDW 15.9 (H) 03/31/2017 0651   LYMPHSABS 3.1 03/21/2010 0550   MONOABS 0.4 03/21/2010 0550   EOSABS 0.2 03/21/2010 0550   BASOSABS 0.0 03/21/2010 0550      Chemistry      Component Value Date/Time   NA 141 03/31/2017 0651   K 3.9 03/31/2017 0651   CL 113 (H) 03/31/2017 0651   CO2 24 03/31/2017 0651   BUN 10 03/31/2017 0651   CREATININE 0.89 03/31/2017 0651      Component Value Date/Time   CALCIUM 8.1 (L) 03/31/2017 0651   ALKPHOS 64 03/30/2017 1630   AST 17 03/30/2017 1630   ALT 7 (L) 03/30/2017 1630   BILITOT 0.3 03/30/2017 1630     EKG 08/10/15: Sinus rhythm  [place] ABD 08/10/15: Cholelithiasis with positive Murphy's sign. These changes are associated with acute  cholecystitis in the appropriate clinical setting.  Anesthesia Physical  Anesthesia Plan  ASA: II  Anesthesia Plan: MAC   Post-op Pain Management:    Induction: Intravenous  PONV Risk Score and Plan: 2 and Ondansetron and Dexamethasone  Airway Management Planned: Oral ETT  Additional Equipment:   Intra-op Plan:   Post-operative Plan:   Informed Consent: I have reviewed the patients History and Physical, chart, labs and discussed the procedure including the risks, benefits and alternatives for the proposed anesthesia with the patient or authorized representative who has indicated his/her understanding and acceptance.   Dental advisory given  Plan Discussed with: CRNA and Anesthesiologist  Anesthesia Plan Comments:         Anesthesia Quick Evaluation

## 2017-04-01 NOTE — Brief Op Note (Signed)
03/30/2017 - 04/01/2017  9:11 AM  PATIENT:  Carol Carlson  76 y.o. female  PRE-OPERATIVE DIAGNOSIS:  lower gi bleed  POST-OPERATIVE DIAGNOSIS:  diverticulosis  PROCEDURE:  Procedure(s): COLONOSCOPY WITH PROPOFOL (N/A)  SURGEON:  Surgeon(s) and Role:    * Ahan Eisenberger, MD - Primary  Findings/recommendations ------------------------------------- - Colonoscopy showed evidence of recent bleeding and multiple diverticula scattered throughout the colon. - No evidence of active bleeding was identified during colonoscopy. - Terminal ileum appeared normal without any evidence of bleeding. - I think most likely source of her bleeding is diverticular probably in the right colon - clear liquid diet today - Monitor H&H - Recommend bleeding scan if continues to have rectal bleeding and drop in hemoglobin - GI will follow    Kathi Der MD, FACP 04/01/2017, 9:12 AM  Pager [phone]  If no answer or after 5 PM call [phone]

## 2017-04-01 NOTE — Op Note (Signed)
[facility] Patient Name: Carol Carlson Procedure Date : 04/01/2017 MRN: [id number] Attending MD: Kathi Der , MD Date of Birth: 07-22-1940 CSN: [id number] Age: 76 Admit Type: Inpatient Procedure:                Colonoscopy Indications:              Rectal bleeding Providers:                Kathi Der, MD, Dwain Sarna, RN, Beryle Beams, Technician, Little Ishikawa, CRNA Referring MD:              Medicines:                Sedation Administered by an Anesthesia Professional Complications:            No immediate complications. Estimated Blood Loss:     Estimated blood loss: none. Procedure:                Pre-Anesthesia Assessment:                           - Prior to the procedure, a History and Physical                            was performed, and patient medications and                            allergies were reviewed. The patient's tolerance of                            previous anesthesia was also reviewed. The risks                            and benefits of the procedure and the sedation                            options and risks were discussed with the patient.                            All questions were answered, and informed consent                            was obtained. Prior Anticoagulants: The patient has                            taken no previous anticoagulant or antiplatelet                            agents. ASA Grade Assessment: II - A patient with                            mild systemic disease. After reviewing the risks  and benefits, the patient was deemed in                            satisfactory condition to undergo the procedure.                           After obtaining informed consent, the colonoscope                            was passed under direct vision. Throughout the                            procedure, the patient's blood pressure, pulse, and           oxygen saturations were monitored continuously. The                            EC-[id number] ([id number]) scope was introduced through                            the anus and advanced to the the terminal ileum,                            with identification of the appendiceal orifice and                            IC valve. The colonoscopy was technically difficult                            and complex due to multiple diverticula in the                            colon. Successful completion of the procedure was                            aided by applying abdominal pressure. The patient                            tolerated the procedure well. The quality of the                            bowel preparation was good. The terminal ileum,                            ileocecal valve, appendiceal orifice, and rectum                            were photographed. Scope In: 8:27:48 AM Scope Out: 8:58:55 AM Scope Withdrawal Time: 0 hours 18 minutes 54 seconds  Total Procedure Duration: 0 hours 31 minutes 7 seconds  Findings:      The perianal and digital rectal examinations were normal.      There is no endoscopic evidence of bleeding in the terminal ileum.      The terminal ileum appeared normal.  Red blood was found in the descending colon, in the transverse colon, in       the ascending colon and in the cecum probably from recent bleeding..       large-volume lavage was performed with clearing of blood but I was not       able to identify any active bleeding source.      Multiple small and large-mouthed diverticula were found in the entire       colon.      Non-bleeding internal hemorrhoids were found during retroflexion. The       hemorrhoids were small. Impression:               - The examined portion of the ileum was normal.                           - Blood in the descending colon, in the transverse                            colon, in the ascending colon and in the cecum.                            - Diverticulosis in the entire examined colon.                           - Non-bleeding internal hemorrhoids.                           - No specimens collected. Moderate Sedation:      Moderate (conscious) sedation was personally administered by an       anesthesia professional. The following parameters were monitored: oxygen       saturation, heart rate, blood pressure, and response to care. Recommendation:           - Return patient to hospital ward for ongoing care.                           - Clear liquid diet.                           - Continue present medications.                           - Do a GI bleeding (tagged RBC) scan if symptoms                            persist.                           - No repeat colonoscopy due to age. Procedure Code(s):        --- Professional ---                           [phone], Colonoscopy, flexible; diagnostic, including                            collection of specimen(s) by brushing or washing,  when performed (separate procedure) Diagnosis Code(s):        --- Professional ---                           K64.8, Other hemorrhoids                           K92.2, Gastrointestinal hemorrhage, unspecified                           K62.5, Hemorrhage of anus and rectum                           K57.30, Diverticulosis of large intestine without                            perforation or abscess without bleeding CPT copyright 2016 American Medical Association. All rights reserved. The codes documented in this report are preliminary and upon coder review may  be revised to meet current compliance requirements. Kathi Der, MD Kathi Der, MD 04/01/2017 9:11:01 AM Number of Addenda: 0

## 2017-04-01 NOTE — Progress Notes (Signed)
CRITICAL VALUE ALERT  Critical Value:  K 2.5 Ca 5.8  Date & Time Notied:  04/01/17 1305  Provider Notified: Sharon Seller  Orders Received/Actions taken: awaiting new orders

## 2017-04-01 NOTE — Anesthesia Postprocedure Evaluation (Signed)
Anesthesia Post Note  Patient: Carol Carlson  Procedure(s) Performed: COLONOSCOPY WITH PROPOFOL (N/A )     Patient location during evaluation: PACU Anesthesia Type: MAC Level of consciousness: awake and alert Pain management: pain level controlled Vital Signs Assessment: post-procedure vital signs reviewed and stable Respiratory status: spontaneous breathing and respiratory function stable Cardiovascular status: stable Postop Assessment: no apparent nausea or vomiting Anesthetic complications: no    Last Vitals:  Vitals:   04/01/17 0920 04/01/17 0930  BP: (!) 116/54 104/79  Pulse: 85 87  Resp: (!) 28 (!) 23  Temp:    SpO2: 100% 99%    Last Pain:  Vitals:   04/01/17 0911  TempSrc: Oral  PainSc:                  Pheng Prokop DANIEL

## 2017-04-02 DIAGNOSIS — I10 Essential (primary) hypertension: Secondary | ICD-10-CM

## 2017-04-02 LAB — BASIC METABOLIC PANEL
Anion gap: 3 — ABNORMAL LOW (ref 5–15)
BUN: 7 mg/dL (ref 6–20)
CALCIUM: 7.9 mg/dL — AB (ref 8.9–10.3)
CO2: 19 mmol/L — AB (ref 22–32)
CREATININE: 0.8 mg/dL (ref 0.44–1.00)
Chloride: 116 mmol/L — ABNORMAL HIGH (ref 101–111)
GFR calc non Af Amer: >60 mL/min (ref >60)
Glucose, Bld: 128 mg/dL — ABNORMAL HIGH (ref 65–99)
Potassium: 5 mmol/L (ref 3.5–5.1)
SODIUM: 138 mmol/L (ref 135–145)

## 2017-04-02 LAB — BPAM RBC
BLOOD PRODUCT EXPIRATION DATE: 201810302359
BLOOD PRODUCT EXPIRATION DATE: 201811072359
BLOOD PRODUCT EXPIRATION DATE: 201811072359
Blood Product Expiration Date: 201810192359
ISSUE DATE / TIME: 201810120437
ISSUE DATE / TIME: 201810120146
ISSUE DATE / TIME: 201810121555
ISSUE DATE / TIME: 201810122043
UNIT TYPE AND RH: 5100
UNIT TYPE AND RH: 5100
Unit Type and Rh: 5100
Unit Type and Rh: 5100

## 2017-04-02 LAB — CBC
HCT: 26.6 % — ABNORMAL LOW (ref 36.0–46.0)
HCT: 22.2 % — ABNORMAL LOW (ref 36.0–46.0)
HEMATOCRIT: 26.2 % — AB (ref 36.0–46.0)
HEMOGLOBIN: 9 g/dL — AB (ref 12.0–15.0)
Hemoglobin: 8.7 g/dL — ABNORMAL LOW (ref 12.0–15.0)
Hemoglobin: 7.3 g/dL — ABNORMAL LOW (ref 12.0–15.0)
MCH: 27.6 pg (ref 26.0–34.0)
MCH: 27.4 pg (ref 26.0–34.0)
MCH: 27.7 pg (ref 26.0–34.0)
MCHC: 33.8 g/dL (ref 30.0–36.0)
MCHC: 32.9 g/dL (ref 30.0–36.0)
MCHC: 33.2 g/dL (ref 30.0–36.0)
MCV: 81.6 fL (ref 78.0–100.0)
MCV: 82.6 fL (ref 78.0–100.0)
MCV: 84.1 fL (ref 78.0–100.0)
PLATELETS: 131 10*3/uL — AB (ref 150–400)
Platelets: 130 10*3/uL — ABNORMAL LOW (ref 150–400)
Platelets: 129 10*3/uL — ABNORMAL LOW (ref 150–400)
RBC: 3.17 MIL/uL — ABNORMAL LOW (ref 3.87–5.11)
RBC: 2.64 MIL/uL — AB (ref 3.87–5.11)
RBC: 3.26 MIL/uL — AB (ref 3.87–5.11)
RDW: 15.3 % (ref 11.5–15.5)
RDW: 17 % — AB (ref 11.5–15.5)
RDW: 16.1 % — AB (ref 11.5–15.5)
WBC: 11.5 10*3/uL — ABNORMAL HIGH (ref 4.0–10.5)
WBC: 13 10*3/uL — AB (ref 4.0–10.5)
WBC: 11.4 10*3/uL — ABNORMAL HIGH (ref 4.0–10.5)

## 2017-04-02 LAB — TYPE AND SCREEN
ABO/RH(D): O POS
ANTIBODY SCREEN: NEGATIVE
UNIT DIVISION: 0
Unit division: 0
Unit division: 0
Unit division: 0

## 2017-04-02 LAB — COMPREHENSIVE METABOLIC PANEL
ALBUMIN: 2.4 g/dL — AB (ref 3.5–5.0)
ALT: 7 U/L — ABNORMAL LOW (ref 14–54)
ANION GAP: 3 — AB (ref 5–15)
AST: 13 U/L — ABNORMAL LOW (ref 15–41)
Alkaline Phosphatase: 39 U/L (ref 38–126)
BILIRUBIN TOTAL: 0.7 mg/dL (ref 0.3–1.2)
BUN: 7 mg/dL (ref 6–20)
CO2: 19 mmol/L — ABNORMAL LOW (ref 22–32)
Calcium: 7.9 mg/dL — ABNORMAL LOW (ref 8.9–10.3)
Chloride: 119 mmol/L — ABNORMAL HIGH (ref 101–111)
Creatinine, Ser: 0.93 mg/dL (ref 0.44–1.00)
GFR calc Af Amer: >60 mL/min (ref >60)
GFR, EST NON AFRICAN AMERICAN: 58 mL/min — AB (ref >60)
GLUCOSE: 106 mg/dL — AB (ref 65–99)
POTASSIUM: 5.4 mmol/L — AB (ref 3.5–5.1)
Sodium: 141 mmol/L (ref 135–145)
TOTAL PROTEIN: 4.3 g/dL — AB (ref 6.5–8.1)

## 2017-04-02 LAB — MAGNESIUM: MAGNESIUM: 2.6 mg/dL — AB (ref 1.7–2.4)

## 2017-04-02 MED ORDER — PANTOPRAZOLE SODIUM 40 MG PO TBEC
40.0000 mg | DELAYED_RELEASE_TABLET | Freq: Every day | ORAL | Status: DC
  Administered 2017-04-03 – 2017-04-06 (×4): 40 mg via ORAL
  Filled 2017-04-02 (×5): qty 1

## 2017-04-02 MED ORDER — HYDROCODONE-ACETAMINOPHEN 5-325 MG PO TABS: 1.0000 | ORAL_TABLET | ORAL | Status: DC | PRN

## 2017-04-02 MED ORDER — ALBUTEROL SULFATE (2.5 MG/3ML) 0.083% IN NEBU
3.0000 mL | INHALATION_SOLUTION | Freq: Four times a day (QID) | RESPIRATORY_TRACT | Status: DC | PRN
  Administered 2017-04-02: 3 mL via RESPIRATORY_TRACT
  Filled 2017-04-02: qty 3

## 2017-04-02 NOTE — Progress Notes (Signed)
[facility] PROGRESS NOTE Subjective No further bleeding. Colonoscopy yesterday showed diverticulosis throughout: with coffee ground material. No bleeding site seen. Abdominal ileum was clear.  Objective: Vital signs in last 24 hours: Temp:  [97.4 F (36.3 C)-98.4 F (36.9 C)] 98.3 F (36.8 C) (10/13 1103) Pulse Rate:  [92-139] 100 (10/13 0900) Resp:  [15-28] 15 (10/13 0900) BP: (90-133)/(52-86) 118/59 (10/13 1103) SpO2:  [99 %-100 %] 100 % (10/13 0900) Last BM Date: 04/01/17  Intake/Output from previous day: 10/12 0701 - 10/13 0700 In: 5211.5 [P.O.:960; I.V.:3175; Blood:976.5; IV Piggyback:100] Out: 800 [Urine:350; Stool:450] Intake/Output this shift: No intake/output data recorded.  PE: General--watching television and no distress  Abdomen--soft and nontender  Lab Results:  Recent Labs  03/30/17 1630  03/31/17 0651 03/31/17 1723 04/01/17 0042 04/01/17 1123 04/02/17 0050 04/02/17 0603  WBC 10.9*  --  7.1  --   --  7.7 13.0* 11.5*  HGB 9.7*  < > 7.5*  7.6* 8.0* 6.3* 5.9* 9.0* 8.7*  HCT 31.2*  < > 24.1*  24.3* 25.7* 20.1* 18.7* 26.6* 26.2*  PLT 295  --  215  --   --  128* 130* 129*  < > = values in this interval not displayed. BMET  Recent Labs  03/30/17 1630 03/31/17 0651 04/01/17 1123 04/02/17 0050 04/02/17 0603  NA 139 141 143 138 141  K 4.5 3.9 2.5* 5.0 5.4*  CL 108 113* 125* 116* 119*  CO2 24 24 15* 19* 19*  CREATININE 1.14* 0.89 0.56 0.80 0.93   LFT  Recent Labs  03/30/17 1630 04/02/17 0603  PROT 6.5 4.3*  AST 17 13*  ALT 7* 7*  ALKPHOS 64 39  BILITOT 0.3 0.7   PT/INR  Recent Labs  04/01/17 1123  LABPROT 16.0*  INR 1.29   PANCREAS No results for input(s): LIPASE in the last 72 hours.       Studies/Results: No results found.  Medications: I have reviewed the patient's current medications.  Assessment:   1. Lower G.I. Bleed. Probably diverticuli   Plan: 1. If hemoglobin remains stable would go ahead and  advanced diet. We will follow.   Reedy Biernat JR,Aidin Doane L 04/02/2017, 11:48 AM  This note was created using voice recognition software. Minor errors may Have occurred unintentionally.  Pager: [phone] If no answer or after hours call [phone]

## 2017-04-02 NOTE — Progress Notes (Signed)
[place] TEAM 1 - Stepdown/ICU TEAM  Carol Carlson  UJW:[id number] DOB: Jun 16, 1941 DOA: 03/30/2017 PCP: Patient, [name]    Brief Narrative:  76 y.o.F w/ a Hx of GERD, HTN, and a GIB requiring hospitalization ~ 2 years ago who presented w/ a one day hx of dark stools from rectum. In the ED her rectal exam (per Dr. Hyacinth Meeker) noted bright red blood.  Subjective: The patient is resting comfortably in bed.  She denies abdominal pain.  She has not had any further bleeding that she has noticed.  She denies chest pain shortness breath fevers or chills.  She is hungry and ready to begin eating.  Assessment & Plan:  Acute blood loss anemia due to GI bleeding  Diverticular source suspected - transfuse as needed to keep hemoglobin at 7.0 or greater - if recurrent bleeding encountered nuclear medicine bleeding scan will be our next step - Hgb appears to be holding steady for now - advance diet and follow   Hypotension with a history of HTN - hemorrhagic shock Due to significant blood loss/hypovolemia - appears to have resolved at this time - follow trend   Severe hypokalemia Corrected   Metabolic acidosis likely lactic acidosis due to hypoperfusion in setting of hemorrhagic shock - resolving w/ stabilization of BP  Severe hypomagnesemia Likely due to poor intake - corrected w/ supplementation   DVT prophylaxis: SCDs Code Status: FULL CODE Family Communication: no family present at bedside  Disposition Plan: SDU  Consultants:  [facility]  Procedures: Colo 10/12 - normal ileum - blood in descending colon transverse colon and ascending colon - extensive diverticulosis  Antimicrobials:  none  Objective: Blood pressure (!) 118/59, pulse 100, temperature 98.3 F (36.8 C), temperature source Oral, resp. rate 15, height  (1.6 m), weight 80.3 kg (177 lb 0.5 oz), SpO2 100 %.  Intake/Output Summary (Last 24 hours) at 04/02/17 1126 Last data filed at 04/02/17 0600  Gross per 24 hour   Intake             2664 ml  Output              350 ml  Net             2314 ml   Filed Weights   03/30/17 1615 03/31/17 0000  Weight: 78.5 kg (173 lb) 80.3 kg (177 lb 0.5 oz)    Examination: General: No acute respiratory distress Lungs: CTA B w/o wheezing or crackles  Cardiovascular: RRR w/o gallup or rub  Abdomen: NT/ND, soft, bs+, no mass  Extremities: No C/C/E B LE   CBC:  Recent Labs Lab 03/30/17 1630  03/31/17 0651 03/31/17 1723 04/01/17 0042 04/01/17 1123 04/02/17 0050 04/02/17 0603  WBC 10.9*  --  7.1  --   --  7.7 13.0* 11.5*  HGB 9.7*  < > 7.5*  7.6* 8.0* 6.3* 5.9* 9.0* 8.7*  HCT 31.2*  < > 24.1*  24.3* 25.7* 20.1* 18.7* 26.6* 26.2*  MCV 85.2  --  84.9  --   --  83.9 81.6 82.6  PLT 295  --  215  --   --  128* 130* 129*  < > = values in this interval not displayed. Basic Metabolic Panel:  Recent Labs Lab 03/30/17 1630 03/31/17 0651 04/01/17 1123 04/02/17 0050 04/02/17 0603  NA 139 141 143 138 141  K 4.5 3.9 2.5* 5.0 5.4*  CL 108 113* 125* 116* 119*  CO2 24 24 15* 19* 19*  GLUCOSE 125* 91 115* 128* 106*  BUN 15 10 5* 7 7  CREATININE 1.14* 0.89 0.56 0.80 0.93  CALCIUM 9.2 8.1* 5.8* 7.9* 7.9*  MG  --   --  1.2*  --  2.6*   GFR: Estimated Creatinine Clearance: 50.9 mL/min (by C-G formula based on SCr of 0.93 mg/dL).  Liver Function Tests:  Recent Labs Lab 03/30/17 1630 04/02/17 0603  AST 17 13*  ALT 7* 7*  ALKPHOS 64 39  BILITOT 0.3 0.7  PROT 6.5 4.3*  ALBUMIN 3.2* 2.4*    Coagulation Profile:  Recent Labs Lab 04/01/17 1123  INR 1.29    Recent Results (from the past 240 hour(s))  MRSA PCR Screening     Status: None   Collection Time: 03/30/17 11:29 PM  Result Value Ref Range Status   MRSA by PCR NEGATIVE NEGATIVE Final    Comment:        The GeneXpert MRSA Assay (FDA approved for NASAL specimens only), is one component of a comprehensive MRSA colonization surveillance program. It is not intended to diagnose  MRSA infection nor to guide or monitor treatment for MRSA infections.      Scheduled Meds: . pantoprazole (PROTONIX) IV  40 mg Intravenous Q12H     LOS: 3 days   Lonia Blood, MD [facility] Office  [phone] Pager - Text Page per Amion as per below:  On-Call/Text Page:      [email or web address]      password [email or web address]  If 7PM-7AM, please contact night-coverage [email or web address] Password [email or web address] 04/02/2017, 11:26 AM

## 2017-04-03 ENCOUNTER — Encounter (HOSPITAL_COMMUNITY): Payer: Self-pay | Admitting: Gastroenterology

## 2017-04-03 LAB — CBC
HCT: 19.8 % — ABNORMAL LOW (ref 36.0–46.0)
HEMATOCRIT: 22.2 % — AB (ref 36.0–46.0)
HEMOGLOBIN: 7.4 g/dL — AB (ref 12.0–15.0)
Hemoglobin: 6.4 g/dL — CL (ref 12.0–15.0)
MCH: 27.1 pg (ref 26.0–34.0)
MCH: 28.2 pg (ref 26.0–34.0)
MCHC: 32.3 g/dL (ref 30.0–36.0)
MCHC: 33.3 g/dL (ref 30.0–36.0)
MCV: 83.9 fL (ref 78.0–100.0)
MCV: 84.7 fL (ref 78.0–100.0)
PLATELETS: 133 10*3/uL — AB (ref 150–400)
Platelets: 130 10*3/uL — ABNORMAL LOW (ref 150–400)
RBC: 2.36 MIL/uL — ABNORMAL LOW (ref 3.87–5.11)
RBC: 2.62 MIL/uL — ABNORMAL LOW (ref 3.87–5.11)
RDW: 16.7 % — AB (ref 11.5–15.5)
RDW: 16 % — ABNORMAL HIGH (ref 11.5–15.5)
WBC: 10.2 10*3/uL (ref 4.0–10.5)
WBC: 10.3 10*3/uL (ref 4.0–10.5)

## 2017-04-03 LAB — BASIC METABOLIC PANEL
ANION GAP: 4 — AB (ref 5–15)
BUN: 13 mg/dL (ref 6–20)
CO2: 21 mmol/L — ABNORMAL LOW (ref 22–32)
Calcium: 7.9 mg/dL — ABNORMAL LOW (ref 8.9–10.3)
Chloride: 116 mmol/L — ABNORMAL HIGH (ref 101–111)
Creatinine, Ser: 1.01 mg/dL — ABNORMAL HIGH (ref 0.44–1.00)
GFR calc Af Amer: >60 mL/min (ref >60)
GFR, EST NON AFRICAN AMERICAN: 52 mL/min — AB (ref >60)
GLUCOSE: 115 mg/dL — AB (ref 65–99)
POTASSIUM: 4.4 mmol/L (ref 3.5–5.1)
SODIUM: 141 mmol/L (ref 135–145)

## 2017-04-03 LAB — PREPARE RBC (CROSSMATCH)

## 2017-04-03 MED ORDER — SODIUM CHLORIDE 0.9 % IV SOLN: Freq: Once | INTRAVENOUS | Status: DC

## 2017-04-03 NOTE — Progress Notes (Signed)
Carol Carlson 2:20 PM  Subjective: Patient without signs of further bleeding and no specific complaints and eating regular food and case discussed with her nurse as well as my partner Dr. Randa Evens and her hospital computer chart was reviewed  Objective: Vital signs stable afebrile no acute distress abdomen is soft nontender okay bowel sounds hemoglobin dropped a little  Assessment: probably resolved diverticular bleeding  Plan: agree with transfusion today observe overnight and if hemoglobin stable okay for discharge from our standpoint tomorrow care with aspirin and blood thinners nonsteroidals as an outpatient and we are happy to see back as needed and please call us and during this hospital stay if we could be of any further assistance  [facility]  Pager [phone] After 5PM or if no answer call [phone]

## 2017-04-03 NOTE — Progress Notes (Signed)
[place] TEAM 1 - Stepdown/ICU TEAM  ARIEANA SOMOZA  ZOX:[id number] DOB: [birth year] DOA: 03/30/2017 PCP: Patient, [name]    Brief Narrative:  76 y.o.F w/ a Hx of GERD, HTN, and a GIB requiring hospitalization ~ 2 years ago who presented w/ a one day hx of ?dark stools from rectum (hx later changed to bloody stools). In the ED her rectal exam (per Dr. Hyacinth Meeker) noted bright red blood.  Subjective: Pt reports a "small" amount of BRBPR w/ a bowel movement yesterday evening.  I am not sure is this is accurate, but her Hgb has dropped.  She denies cp, n/v, abdom pain, or sob.  She is not lightheaded or dizzy.    Assessment & Plan:  Acute blood loss anemia due to GI bleeding  Diverticular source suspected - transfuse as needed to keep hemoglobin at 7.0 or greater - if recurrent bleeding encountered nuclear medicine bleeding scan will be our next step - Hgb appears to be drifting back down now, but it is not clear if she is still bleeding or just equilibrating - transfusing further today for a total thus far of 5 units of PRBC - cont to follow in serial fashion   Recent Labs Lab 04/01/17 1123 04/02/17 0050 04/02/17 0603 04/02/17 2004 04/03/17 0332  HGB 5.9* 9.0* 8.7* 7.3* 6.4*    Hypotension with a history of HTN - hemorrhagic shock Due to significant blood loss/hypovolemia - BP stable in normal range at this time   Severe hypokalemia Corrected   Metabolic acidosis likely lactic acidosis due to hypoperfusion in setting of hemorrhagic shock - resolving w/ stabilization of BP  Severe hypomagnesemia Likely due to poor intake - corrected w/ supplementation   DVT prophylaxis: SCDs Code Status: FULL CODE Family Communication: no family present at bedside  Disposition Plan: SDU  Consultants:  [facility]  Procedures: Colo 10/12 - normal ileum - blood in descending colon transverse colon and ascending colon - extensive diverticulosis  Antimicrobials:  none  Objective: Blood  pressure (!) 121/57, pulse 89, temperature 98.8 F (37.1 C), temperature source Oral, resp. rate (!) 21, height  (1.6 m), weight 80.3 kg (177 lb 0.5 oz), SpO2 99 %.  Intake/Output Summary (Last 24 hours) at 04/03/17 1354 Last data filed at 04/03/17 1118  Gross per 24 hour  Intake              400 ml  Output              200 ml  Net              200 ml   Filed Weights   03/30/17 1615 03/31/17 0000  Weight: 78.5 kg (173 lb) 80.3 kg (177 lb 0.5 oz)    Examination: General: NAD Lungs: CTA B  Cardiovascular: RRR w/o gallup or rub  Abdomen: NT/ND, soft, bs+, no mass  Extremities: No edema B LE   CBC:  Recent Labs Lab 04/01/17 1123 04/02/17 0050 04/02/17 0603 04/02/17 2004 04/03/17 0332  WBC 7.7 13.0* 11.5* 11.4* 10.2  HGB 5.9* 9.0* 8.7* 7.3* 6.4*  HCT 18.7* 26.6* 26.2* 22.2* 19.8*  MCV 83.9 81.6 82.6 84.1 83.9  PLT 128* 130* 129* 131* 133*   Basic Metabolic Panel:  Recent Labs Lab 03/31/17 0651 04/01/17 1123 04/02/17 0050 04/02/17 0603 04/03/17 0332  NA 141 143 138 141 141  K 3.9 2.5* 5.0 5.4* 4.4  CL 113* 125* 116* 119* 116*  CO2 24 15* 19* 19* 21*  GLUCOSE 91 115* 128* 106* 115*  BUN 10 5* CREATININE 0.89 0.56 0.80 0.93 1.01*  CALCIUM 8.1* 5.8* 7.9* 7.9* 7.9*  MG  --  1.2*  --  2.6*  --    GFR: Estimated Creatinine Clearance: 46.8 mL/min (A) (by C-G formula based on SCr of 1.01 mg/dL (H)).  Liver Function Tests:  Recent Labs Lab 03/30/17 1630 04/02/17 0603  AST 17 13*  ALT 7* 7*  ALKPHOS 64 39  BILITOT 0.3 0.7  PROT 6.5 4.3*  ALBUMIN 3.2* 2.4*    Coagulation Profile:  Recent Labs Lab 04/01/17 1123  INR 1.29    Recent Results (from the past 240 hour(s))  MRSA PCR Screening     Status: None   Collection Time: 03/30/17 11:29 PM  Result Value Ref Range Status   MRSA by PCR NEGATIVE NEGATIVE Final    Comment:        The GeneXpert MRSA Assay (FDA approved for NASAL specimens only), is one component of a comprehensive MRSA  colonization surveillance program. It is not intended to diagnose MRSA infection nor to guide or monitor treatment for MRSA infections.      Scheduled Meds: . pantoprazole  40 mg Oral Daily     LOS: 4 days   Lonia Blood, MD [facility] Office  [phone] Pager - Text Page per Amion as per below:  On-Call/Text Page:      [email or web address]      password [email or web address]  If 7PM-7AM, please contact night-coverage [email or web address] Password [email or web address] 04/03/2017, 1:54 PM

## 2017-04-04 LAB — COMPREHENSIVE METABOLIC PANEL
ALBUMIN: 2.6 g/dL — AB (ref 3.5–5.0)
ALK PHOS: 39 U/L (ref 38–126)
ALT: 8 U/L — ABNORMAL LOW (ref 14–54)
AST: 15 U/L (ref 15–41)
Anion gap: 5 (ref 5–15)
BILIRUBIN TOTAL: 0.5 mg/dL (ref 0.3–1.2)
BUN: 8 mg/dL (ref 6–20)
CALCIUM: 8.6 mg/dL — AB (ref 8.9–10.3)
CO2: 22 mmol/L (ref 22–32)
Chloride: 114 mmol/L — ABNORMAL HIGH (ref 101–111)
Creatinine, Ser: 0.92 mg/dL (ref 0.44–1.00)
GFR calc Af Amer: >60 mL/min (ref >60)
GFR calc non Af Amer: 59 mL/min — ABNORMAL LOW (ref >60)
GLUCOSE: 100 mg/dL — AB (ref 65–99)
POTASSIUM: 4 mmol/L (ref 3.5–5.1)
Sodium: 141 mmol/L (ref 135–145)
TOTAL PROTEIN: 4.8 g/dL — AB (ref 6.5–8.1)

## 2017-04-04 LAB — TYPE AND SCREEN
ABO/RH(D): O POS
Antibody Screen: NEGATIVE
Unit division: 0

## 2017-04-04 LAB — CBC
HEMATOCRIT: 23.5 % — AB (ref 36.0–46.0)
HEMOGLOBIN: 7.8 g/dL — AB (ref 12.0–15.0)
MCH: 28.3 pg (ref 26.0–34.0)
MCHC: 33.2 g/dL (ref 30.0–36.0)
MCV: 85.1 fL (ref 78.0–100.0)
Platelets: 155 10*3/uL (ref 150–400)
RBC: 2.76 MIL/uL — AB (ref 3.87–5.11)
RDW: 16.3 % — ABNORMAL HIGH (ref 11.5–15.5)
WBC: 10.8 10*3/uL — AB (ref 4.0–10.5)

## 2017-04-04 LAB — BPAM RBC
BLOOD PRODUCT EXPIRATION DATE: 201811052359
ISSUE DATE / TIME: 201810140924
UNIT TYPE AND RH: 5100

## 2017-04-04 MED ORDER — AMLODIPINE BESY-BENAZEPRIL HCL 10-40 MG PO CAPS: 1.0000 | ORAL_CAPSULE | Freq: Every day | ORAL | Status: DC

## 2017-04-04 MED ORDER — BENAZEPRIL HCL 40 MG PO TABS
40.0000 mg | ORAL_TABLET | Freq: Every day | ORAL | Status: DC
  Administered 2017-04-04 – 2017-04-06 (×3): 40 mg via ORAL
  Filled 2017-04-04 (×4): qty 1

## 2017-04-04 MED ORDER — AMLODIPINE BESYLATE 10 MG PO TABS
10.0000 mg | ORAL_TABLET | Freq: Every day | ORAL | Status: DC
  Administered 2017-04-04 – 2017-04-06 (×3): 10 mg via ORAL
  Filled 2017-04-04 (×3): qty 1

## 2017-04-04 NOTE — Care Management Important Message (Signed)
Important Message  Patient Details  Name: Carol Carlson MRN: [id number] Date of Birth: April 10, 1941   Medicare Important Message Given:  Yes    [name] Abena 04/04/2017, 10:10 AM

## 2017-04-04 NOTE — Progress Notes (Signed)
[place] TEAM 1 - Stepdown/ICU TEAM  Carol Carlson  ZOX:[id number] DOB: [birth year] DOA: 03/30/2017 PCP: Patient, [name]    Brief Narrative:  76 y.o.F w/ a Hx of GERD, HTN, and a GIB requiring hospitalization ~ 2 years ago who presented w/ a one day hx of ?dark stools from rectum (hx later changed to bloody stools). In the ED her rectal exam (per Dr. Hyacinth Carlson) noted bright red Carlson.  Subjective: The patient has not had a bowel movement since yesterday.  She tells me she felt mildly lightheaded this morning when she got up.  She denies chest pain shortness breath or dizziness.  She denies abdominal pain.  Assessment & Plan:  Acute Carlson loss anemia due to GI bleeding  Diverticular source suspected - transfuse as needed to keep hemoglobin at 7.0 or greater - if recurrent bleeding encountered nuclear medicine bleeding scan will be our next step - Hgb drifted back down after initial stabilization, but is presently holding steady after another unit of PRBC - transfused a total thus far of 5 units of PRBC - cont to follow in serial fashion   Recent Labs Lab 04/02/17 0603 04/02/17 2004 04/03/17 0332 04/03/17 1654 04/04/17 0350  HGB 8.7* 7.3* 6.4* 7.4* 7.8*    Hypotension with a history of HTN - hemorrhagic shock Due to significant Carlson loss/hypovolemia - BP now elevated - resume usual home medical tx   Severe hypokalemia Corrected   Metabolic acidosis likely lactic acidosis due to hypoperfusion in setting of hemorrhagic shock - resolved  Severe hypomagnesemia Likely due to poor intake - corrected w/ supplementation   DVT prophylaxis: SCDs Code Status: FULL CODE Family Communication: spoke with multiple family members at bedside Disposition Plan: plan for discharge home in a.m. If hemoglobin stable and intake good  Consultants:  [facility]  Procedures: Colo 10/12 - normal ileum - Carlson in descending colon transverse colon and ascending colon - extensive  diverticulosis  Antimicrobials:  none  Objective: Carlson pressure (!) 174/62, pulse 84, temperature 98.1 F (36.7 C), temperature source Oral, resp. rate 19, height  (1.6 m), weight 80.3 kg (177 lb 0.5 oz), SpO2 100 %.  Intake/Output Summary (Last 24 hours) at 04/04/17 1625 Last data filed at 04/04/17 1526  Gross per 24 hour  Intake              400 ml  Output             2600 ml  Net            -2200 ml   Filed Weights   03/30/17 1615 03/31/17 0000  Weight: 78.5 kg (173 lb) 80.3 kg (177 lb 0.5 oz)    Examination: General: NAD - alert and pleasant  Lungs: CTA B w/o wheezing or crackles  Cardiovascular: RRR w/o M Abdomen: NT/ND, soft, bs+, no mass  Extremities: No edema B LE   CBC:  Recent Labs Lab 04/02/17 0603 04/02/17 2004 04/03/17 0332 04/03/17 1654 04/04/17 0350  WBC 11.5* 11.4* 10.2 10.3 10.8*  HGB 8.7* 7.3* 6.4* 7.4* 7.8*  HCT 26.2* 22.2* 19.8* 22.2* 23.5*  MCV 82.6 84.1 83.9 84.7 85.1  PLT 129* 131* 133* 130* 155   Basic Metabolic Panel:  Recent Labs Lab 04/01/17 1123 04/02/17 0050 04/02/17 0603 04/03/17 0332 04/04/17 0350  NA 143 138 141 141 141  K 2.5* 5.0 5.4* 4.4 4.0  CL 125* 116* 119* 116* 114*  CO2 15* 19* 19* 21* 22  GLUCOSE 115*  128* 106* 115* 100*  BUN 5* CREATININE 0.56 0.80 0.93 1.01* 0.92  CALCIUM 5.8* 7.9* 7.9* 7.9* 8.6*  MG 1.2*  --  2.6*  --   --    GFR: Estimated Creatinine Clearance: 51.4 mL/min (by C-G formula based on SCr of 0.92 mg/dL).  Liver Function Tests:  Recent Labs Lab 03/30/17 1630 04/02/17 0603 04/04/17 0350  AST 17 13* 15  ALT 7* 7* 8*  ALKPHOS 64 39 39  BILITOT 0.3 0.7 0.5  PROT 6.5 4.3* 4.8*  ALBUMIN 3.2* 2.4* 2.6*    Coagulation Profile:  Recent Labs Lab 04/01/17 1123  INR 1.29    Recent Results (from the past 240 hour(s))  MRSA PCR Screening     Status: None   Collection Time: 03/30/17 11:29 PM  Result Value Ref Range Status   MRSA by PCR NEGATIVE NEGATIVE Final     Comment:        The GeneXpert MRSA Assay (FDA approved for NASAL specimens only), is one component of a comprehensive MRSA colonization surveillance program. It is not intended to diagnose MRSA infection nor to guide or monitor treatment for MRSA infections.      Scheduled Meds: . pantoprazole  40 mg Oral Daily     LOS: 5 days   Carol Blood, MD Carol Carlson Office  [phone] Pager - Text Page per Amion as per below:  On-Call/Text Page:      [email or web address]      password [email or web address]  If 7PM-7AM, please contact night-coverage [email or web address] Password [email or web address] 04/04/2017, 4:25 PM

## 2017-04-05 LAB — CBC
HCT: 25.4 % — ABNORMAL LOW (ref 36.0–46.0)
HEMATOCRIT: 22.5 % — AB (ref 36.0–46.0)
Hemoglobin: 7.4 g/dL — ABNORMAL LOW (ref 12.0–15.0)
Hemoglobin: 8.2 g/dL — ABNORMAL LOW (ref 12.0–15.0)
MCH: 28.2 pg (ref 26.0–34.0)
MCH: 28.2 pg (ref 26.0–34.0)
MCHC: 32.9 g/dL (ref 30.0–36.0)
MCHC: 32.3 g/dL (ref 30.0–36.0)
MCV: 85.9 fL (ref 78.0–100.0)
MCV: 87.3 fL (ref 78.0–100.0)
PLATELETS: 236 10*3/uL (ref 150–400)
Platelets: 205 10*3/uL (ref 150–400)
RBC: 2.62 MIL/uL — ABNORMAL LOW (ref 3.87–5.11)
RBC: 2.91 MIL/uL — ABNORMAL LOW (ref 3.87–5.11)
RDW: 15.9 % — AB (ref 11.5–15.5)
RDW: 16.5 % — ABNORMAL HIGH (ref 11.5–15.5)
WBC: 9.3 10*3/uL (ref 4.0–10.5)
WBC: 11.4 10*3/uL — ABNORMAL HIGH (ref 4.0–10.5)

## 2017-04-05 NOTE — Discharge Summary (Signed)
DISCHARGE SUMMARY  Carol Carlson  MR#: [id number]  DOB:18-May-1941  Date of Admission: 03/30/2017 Date of Discharge: 04/05/2017  Attending Physician:Tamy Accardo T  Patient's PCP:  Dr. Fleet Contras  Consults:  GI  Disposition: D/C home   Follow-up Appts: Follow-up Information    Fleet Contras, MD Follow up in 3 day(s).   Specialty:  Internal Medicine Contact information: [address] [place] [address] [phone]           Tests Needing Follow-up: -Hgb should be checked in 3-5 days  Discharge Diagnoses: Acute blood loss anemia due to GI bleeding  Hypotension with a history of HTN - hemorrhagic shock Severe hypokalemia Metabolic acidosis Severe hypomagnesemia   Initial presentation: 76 y.o.F w/ a Hxof GERD, HTN, and a GIB requiring hospitalization ~2 years agowho presented w/ a one day hx of ?dark stools from rectum (hx later changed to bloody stools). In the ED her rectal exam (per Dr. Hyacinth Meeker) noted bright red blood.  Hospital Course:  Acute blood loss anemia due to GI bleeding  Diverticular source suspected - transfused as needed to keep hemoglobin at 7.0 or greater - Hgb drifted back down after initial stabilization, but then held steady after another unit of PRBC - transfused a total of 5 units of PRBC during this hospital stay - no longer passing BRBPR at time of d/c   Hypotension with a history of HTN - hemorrhagic shock Due to significant blood loss/hypovolemia - BP meds held initially - after volume/blood resuscitation BP became increased again and home meds were resumed - BP stable at time of d/c   Severe hypokalemia Corrected   Metabolic acidosis likely lactic acidosis due to hypoperfusion in setting of hemorrhagic shock - resolved  Severe hypomagnesemia Likely due to poor intake - corrected w/ supplementation   Allergies as of 04/05/2017      Reactions   Aspirin Other (See Comments)   Can only take    makes  heart race   Hydrocodone-acetaminophen Nausea And Vomiting      Medication List    STOP taking these medications   HYDROcodone-acetaminophen 5-325 MG tablet Commonly known as:  NORCO/VICODIN     TAKE these medications   acetaminophen 325 MG tablet Commonly known as:  TYLENOL Take 2 tablets (650 mg total) by mouth every 6 (six) hours as needed for mild pain, moderate pain, fever or headache.   albuterol 108 (90 Base) MCG/ACT inhaler Commonly known as:  PROVENTIL HFA;VENTOLIN HFA Inhale 1-2 puffs into the lungs every 6 (six) hours as needed for wheezing or shortness of breath.   amLODipine-benazepril 10-40 MG capsule Commonly known as:  LOTREL Take 1 capsule by mouth daily.   BC FAST PAIN RELIEF 650-195-33.3 MG Pack Generic drug:  Aspirin-Salicylamide-Caffeine Take 1 Package by mouth as needed (aching).   cetirizine 10 MG tablet Commonly known as:  ZYRTEC Take 10 mg by mouth daily as needed for allergies.   CORICIDIN HBP COLD/FLU PO Take by mouth. One table spoon full   esomeprazole 20 MG capsule Commonly known as:  NEXIUM Take 20 mg by mouth daily as needed (acid reflux).       Day of Discharge BP (!) 108/52 (BP Location: Left Arm)   Pulse 84   Temp 98.6 F (37 C) (Oral)   Resp 19   Ht  (1.6 m)   Wt 80.3 kg (177 lb 0.5 oz)   SpO2 100%   BMI 31.36 kg/m   Physical Exam: General: No acute respiratory  distress Lungs: Clear to auscultation bilaterally without wheezes or crackles Cardiovascular: Regular rate and rhythm without murmur gallop or rub normal S1 and S2 Abdomen: Nontender, nondistended, soft, bowel sounds positive, no rebound, no ascites, no appreciable mass Extremities: No significant cyanosis, clubbing, or edema bilateral lower extremities  Basic Metabolic Panel:  Recent Labs Lab 04/01/17 1123 04/02/17 0050 04/02/17 0603 04/03/17 0332 04/04/17 0350  NA 143 138 141 141 141  K 2.5* 5.0 5.4* 4.4 4.0  CL 125* 116* 119* 116* 114*  CO2 15*  19* 19* 21* 22  GLUCOSE 115* 128* 106* 115* 100*  BUN 5* CREATININE 0.56 0.80 0.93 1.01* 0.92  CALCIUM 5.8* 7.9* 7.9* 7.9* 8.6*  MG 1.2*  --  2.6*  --   --     Liver Function Tests:  Recent Labs Lab 03/30/17 1630 04/02/17 0603 04/04/17 0350  AST 17 13* 15  ALT 7* 7* 8*  ALKPHOS 64 39 39  BILITOT 0.3 0.7 0.5  PROT 6.5 4.3* 4.8*  ALBUMIN 3.2* 2.4* 2.6*    Coags:  Recent Labs Lab 04/01/17 1123  INR 1.29    CBC:  Recent Labs Lab 04/02/17 2004 04/03/17 0332 04/03/17 1654 04/04/17 0350 04/05/17 0344  WBC 11.4* 10.2 10.3 10.8* 9.3  HGB 7.3* 6.4* 7.4* 7.8* 7.4*  HCT 22.2* 19.8* 22.2* 23.5* 22.5*  MCV 84.1 83.9 84.7 85.1 85.9  PLT 131* 133* 130* 155 205    Recent Results (from the past 240 hour(s))  MRSA PCR Screening     Status: None   Collection Time: 03/30/17 11:29 PM  Result Value Ref Range Status   MRSA by PCR NEGATIVE NEGATIVE Final    Comment:        The GeneXpert MRSA Assay (FDA approved for NASAL specimens only), is one component of a comprehensive MRSA colonization surveillance program. It is not intended to diagnose MRSA infection nor to guide or monitor treatment for MRSA infections.      Time spent in discharge (includes decision making & examination of pt): 35 minutes  04/05/2017, 2:41 PM   Lonia Blood, MD [facility] Office  [phone] Pager [phone]  On-Call/Text Page:      [email or web address]      password [facility]

## 2017-04-05 NOTE — Progress Notes (Signed)
  [place] TEAM 1 - Stepdown/ICU TEAM  After examining the patient and obtaining a history from her of passing only a dark brown stool today I informed her that she was cleared for discharge and prepared her discharge paperwork.  After this was all done I was approached by her nurse who informed me that in fact the patient had had a couple of maroon colored stools with clear blood clots.  As a result I do not feel it is currently safe to discharge the patient home.  I have informed her of this change plans.  We will recheck her hemoglobin tonight and again in the morning.  If her hemoglobin remains stable we will reconsider discharge 10/17.  Lonia Blood, MD [facility] Office  [phone] Pager - Text Page per Amion as per below:  On-Call/Text Page:      [email or web address]      password [email or web address]  If 7PM-7AM, please contact night-coverage [email or web address] Password [email or web address] 04/05/2017, 5:20 PM

## 2017-04-05 NOTE — Discharge Instructions (Signed)
Gastrointestinal Bleeding °Gastrointestinal (GI) bleeding is bleeding somewhere along the digestive tract, between the mouth and anus. This can be caused by various problems. The severity of these problems can range from mild to serious or even life-threatening. If you have GI bleeding, you may find blood in your stools (feces), you may have black stools, or you may vomit blood. If there is a lot of bleeding, you may need to stay in the hospital. °What are the causes? °This condition may be caused by: °· Esophagitis. This is inflammation, irritation, or swelling of the esophagus. °· Hemorrhoids. These are swollen veins in the rectum. °· Anal fissures. These are areas of painful tearing that are often caused by passing hard stool. °· Diverticulosis. These are pouches that form on the colon over time, with age, and may bleed a lot. °· Diverticulitis. This is inflammation in areas with diverticulosis. It can cause pain, fever, and bloody stools, although bleeding may be mild. °· Polyps and cancer. Colon cancer often starts out as precancerous polyps. °· Gastritis and ulcers. With these, bleeding may come from the upper GI tract, near the stomach. ° °What are the signs or symptoms? °Symptoms of this condition may include: °· Bright red blood in your vomit, or vomit that looks like coffee grounds. °· Bloody, black, or tarry stools. °? Bleeding from the lower GI tract will usually cause red or maroon blood in the stools. °? Bleeding from the upper GI tract may cause black, tarry, often bad-smelling stools. °? In certain cases, if the bleeding is fast enough, the stools may be red. °· Pain or cramping in the abdomen. ° °How is this diagnosed? °This condition may be diagnosed based on: °· Medical history and physical exam. °· Various tests, such as: °? Blood tests. °? X-rays and other imaging tests. °? Esophagogastroduodenoscopy (EGD). In this test, a flexible, lighted tube is used to look at your esophagus, stomach, and  small intestine. °? Colonoscopy. In this test, a flexible, lighted tube is used to look at your colon. ° °How is this treated? °Treatment for this condition depends on the cause of the bleeding. For example: °· For bleeding from the esophagus, stomach, small intestine, or colon, the health care provider doing your EGD or colonoscopy may be able to stop the bleeding as part of the procedure. °· Inflammation or infection of the colon can be treated with medicines. °· Certain rectal problems can be treated with creams, suppositories, or warm baths. °· Surgery is sometimes needed. °· Blood transfusions are sometimes needed if a lot of blood has been lost. ° °If bleeding is slow, you may be allowed to go home. If there is a lot of bleeding, you will need to stay in the hospital for observation. °Follow these instructions at home: °· Take over-the-counter and prescription medicines only as told by your health care provider. °· Eat foods that are high in fiber. This will help to keep your stools soft. These foods include whole grains, legumes, fruits, and vegetables. Eating 1-3 prunes each day works well for many people. °· Drink enough fluid to keep your urine clear or pale yellow. °· Keep all follow-up visits as told by your health care provider. This is important. °Contact a health care provider if: °· Your symptoms do not improve. °Get help right away if: °· Your bleeding increases. °· You feel light-headed or you faint. °· You feel weak. °· You have severe cramps in your back or abdomen. °· You pass large blood clots in your stool. °·   Your symptoms are getting worse. °This information is not intended to replace advice given to you by your health care provider. Make sure you discuss any questions you have with your health care provider. °Document Released: 06/04/2000 Document Revised: 11/05/2015 Document Reviewed: 11/25/2014 °Elsevier Interactive Patient Education © 2018 [employer] ° °Diverticulosis °Diverticulosis  is a condition that develops when small pouches (diverticula) form in the wall of the large intestine (colon). The colon is where water is absorbed and stool is formed. The pouches form when the inside layer of the colon pushes through weak spots in the outer layers of the colon. You may have a few pouches or many of them. °What are the causes? °The cause of this condition is not known. °What increases the risk? °The following factors may make you more likely to develop this condition: °· Being older than age 60. Your risk for this condition increases with age. Diverticulosis is rare among people younger than age 30. By age 80, many people have it. °· Eating a low-fiber diet. °· Having frequent constipation. °· Being overweight. °· Not getting enough exercise. °· Smoking. °· Taking over-the-counter pain medicines, like aspirin and ibuprofen. °· Having a family history of diverticulosis. ° °What are the signs or symptoms? °In most people, there are no symptoms of this condition. If you do have symptoms, they may include: °· Bloating. °· Cramps in the abdomen. °· Constipation or diarrhea. °· Pain in the lower left side of the abdomen. ° °How is this diagnosed? °This condition is most often diagnosed during an exam for other colon problems. Because diverticulosis usually has no symptoms, it often cannot be diagnosed independently. This condition may be diagnosed by: °· Using a flexible scope to examine the colon (colonoscopy). °· Taking an X-ray of the colon after dye has been put into the colon (barium enema). °· Doing a CT scan. ° °How is this treated? °You may not need treatment for this condition if you have never developed an infection related to diverticulosis. If you have had an infection before, treatment may include: °· Eating a high-fiber diet. This may include eating more fruits, vegetables, and grains. °· Taking a fiber supplement. °· Taking a live bacteria supplement (probiotic). °· Taking medicine to  relax your colon. °· Taking antibiotic medicines. ° °Follow these instructions at home: °· Drink 6-8 glasses of water or more each day to prevent constipation. °· Try not to strain when you have a bowel movement. °· If you have had an infection before: °? Eat more fiber as directed by your health care provider or your diet and nutrition specialist (dietitian). °? Take a fiber supplement or probiotic, if your health care provider approves. °· Take over-the-counter and prescription medicines only as told by your health care provider. °· If you were prescribed an antibiotic, take it as told by your health care provider. Do not stop taking the antibiotic even if you start to feel better. °· Keep all follow-up visits as told by your health care provider. This is important. °Contact a health care provider if: °· You have pain in your abdomen. °· You have bloating. °· You have cramps. °· You have not had a bowel movement in 3 days. °Get help right away if: °· Your pain gets worse. °· Your bloating becomes very bad. °· You have a fever or chills, and your symptoms suddenly get worse. °· You vomit. °· You have bowel movements that are bloody or black. °· You have bleeding from your   rectum. °Summary °· Diverticulosis is a condition that develops when small pouches (diverticula) form in the wall of the large intestine (colon). °· You may have a few pouches or many of them. °· This condition is most often diagnosed during an exam for other colon problems. °· If you have had an infection related to diverticulosis, treatment may include increasing the fiber in your diet, taking supplements, or taking medicines. °This information is not intended to replace advice given to you by your health care provider. Make sure you discuss any questions you have with your health care provider. °Document Released: 03/04/2004 Document Revised: 04/26/2016 Document Reviewed: 04/26/2016 °Elsevier Interactive Patient Education © 2017 Elsevier  Inc. ° °

## 2017-04-05 NOTE — Plan of Care (Signed)
Problem: Pain Managment: Goal: General experience of comfort will improve Outcome: Progressing Discussed need to limit NSAIDS  Problem: Activity: Goal: Risk for activity intolerance will decrease Outcome: Progressing Pt sat up in chair today for meals, etc.  Problem: Nutrition: Goal: Adequate nutrition will be maintained Outcome: Progressing Pt eating well with good appetite.  Problem: Bowel/Gastric: Goal: Will show no signs and symptoms of gastrointestinal bleeding Outcome: Progressing Pt continues to have burgundy stools.

## 2017-04-06 DIAGNOSIS — K5731 Diverticulosis of large intestine without perforation or abscess with bleeding: Secondary | ICD-10-CM

## 2017-04-06 LAB — CBC
HCT: 22.3 % — ABNORMAL LOW (ref 36.0–46.0)
HEMOGLOBIN: 7.2 g/dL — AB (ref 12.0–15.0)
MCH: 28.5 pg (ref 26.0–34.0)
MCHC: 32.3 g/dL (ref 30.0–36.0)
MCV: 88.1 fL (ref 78.0–100.0)
PLATELETS: 232 10*3/uL (ref 150–400)
RBC: 2.53 MIL/uL — AB (ref 3.87–5.11)
RDW: 16.7 % — ABNORMAL HIGH (ref 11.5–15.5)
WBC: 8.6 10*3/uL (ref 4.0–10.5)

## 2017-04-06 LAB — HEMOGLOBIN AND HEMATOCRIT, BLOOD
HEMATOCRIT: 23.9 % — AB (ref 36.0–46.0)
HEMOGLOBIN: 7.6 g/dL — AB (ref 12.0–15.0)

## 2017-04-06 NOTE — Progress Notes (Signed)
D/c paperwork and instructions gone over w/ pt. Pt states no questions or concerns. Pts grand-daughter on way to pick pt up. Pt to d/c to home w/ son.

## 2017-04-06 NOTE — Progress Notes (Signed)
Pts family here to take pt home. Pt taken out via wheelchair. D/c'd to home

## 2017-04-06 NOTE — Discharge Summary (Signed)
Physician Discharge Summary  Carol HartshornLela M Carlson WGN:[id number] DOB: 20-Jul-1940 DOA: 03/30/2017  PCP: [name]  Admit date: 03/30/2017 Discharge date: 04/06/2017  Time spent: 35minutes  Recommendations for Outpatient Follow-up:  Acute Blood loss Anemia due to LowerGI bleeding/Diverticulosis  Diverticular source suspected - transfuse as needed to keep hemoglobin at 7.0 or greater - if recurrent bleeding encountered nuclear medicine bleeding scan will be our next step - Hgb drifted back down after initial stabilization, but is presently holding steady after another unit of PRBC - transfused a total thus far of 5 units of PRBC - cont to follow in serial fashion    Recent Labs Lab 04/03/17 1654 04/04/17 0350 04/05/17 0344 04/05/17 1801 04/06/17 0311 04/06/17 0832  HGB 7.4* 7.8* 7.4* 8.2* 7.2* 7.6*  -hemoglobin stable but low -scheduled follow-up with [name] on October 25 at noon acute blood loss anemia, GI bleed, HTN.  Hypotension with a history of HTN/Hemorrhagic shock -Due to significant blood loss/hypovolemia  -resolved -patient ambulated around ward asymptomatic   Severe Hypokalemia -Corrected    Metabolic Acidosis -likely lactic acidosis due to hypoperfusion in setting of hemorrhagic shock  - resolved   Severe Hypomagnesemia -Likely due to poor intake  -corrected w/ supplementation          Discharge Diagnoses:  Principal Problem:   GIB (gastrointestinal bleeding) Active Problems:   Essential hypertension   GERD (gastroesophageal reflux disease)   Acute blood loss anemia   Heme positive stool   Discharge Condition: stable  Diet recommendation: regular  Filed Weights   03/30/17 1615 03/31/17 0000  Weight: 173 lb (78.5 kg) 177 lb 0.5 oz (80.3 kg)    History of present illness:  76 y.o. BF PMHx GERD, HTN, GI bleed (per patient hospitalized here about 2 years ago (cannot find in EP[facility]), per patient  and she had a scope here (cannot find in  EPIC).  Comes in with one day of dark stools from rectum.  She previously said they were bright red then said they were darker maroonish with dark clots .  She had 4 different bloody bowel movements today.  No n/v.  Some lower abd cramping.  No fevers.  Rectal per dr [name] was bright red blood on exam.  Pt referred for admission for anemia and gib.  During his hospitalization patient was evaluated for GI bleed. Colonoscopy revealed extensive diverticulosis see results below.Patient was transfused multiple units of PRBC. GI bleed spontaneously resolved. Patient stable for discharge     Procedures: 10/12 Colonoscopy: Normal ileum, blood in descending colon and transverse colon and ascending colon. Extensive diverticulosis  Consultations: [name]    Discharge Exam: Vitals:   04/06/17 0000 04/06/17 0400 04/06/17 0758 04/06/17 1220  BP: (!) 107/33 (!) 112/57  112/71  Pulse:   87 81  Resp: 20 18 18 17   Temp: 98.1 F (36.7 C) 98.2 F (36.8 C) 97.8 F (36.6 C) 98.1 F (36.7 C)  TempSrc: Oral Oral Oral Oral  SpO2: 98% 99% 100% 100%  Weight:      Height:        General: A/O 4, NAD, Cardiovascular: regular rhythm and rate, negative murmurs rubs or gallops, normal S1/S2 Respiratory: clear to auscultation bilateral, negative wheezes, no crackles    Discharge Instructions  Discharge Instructions    Increase activity slowly    Complete by:  As directed      Allergies as of 04/06/2017      Reactions   Aspirin Other (See  Comments)   Can only take 81mg   325mg  makes heart race   Hydrocodone-acetaminophen Nausea And Vomiting      Medication List    STOP taking these medications   HYDROcodone-acetaminophen 5-325 MG tablet Commonly known as:  NORCO/VICODIN     TAKE these medications   acetaminophen 325 MG tablet Commonly known as:  TYLENOL Take 2 tablets (650 mg total) by mouth every 6 (six) hours as needed for mild pain, moderate pain, fever or headache.   albuterol  108 (90 Base) MCG/ACT inhaler Commonly known as:  PROVENTIL HFA;VENTOLIN HFA Inhale 1-2 puffs into the lungs every 6 (six) hours as needed for wheezing or shortness of breath.   amLODipine-benazepril 10-40 MG capsule Commonly known as:  LOTREL Take 1 capsule by mouth daily.   BC FAST PAIN RELIEF 650-195-33.3 MG Pack Generic drug:  Aspirin-Salicylamide-Caffeine Take 1 Package by mouth as needed (aching).   cetirizine 10 MG tablet Commonly known as:  ZYRTEC Take 10 mg by mouth daily as needed for allergies.   CORICIDIN HBP COLD/FLU PO Take by mouth. One table spoon full   esomeprazole 20 MG capsule Commonly known as:  NEXIUM Take 20 mg by mouth daily as needed (acid reflux).      Allergies  Allergen Reactions  . Aspirin Other (See Comments)    Can only take 81mg   325mg  makes heart race  . Hydrocodone-Acetaminophen Nausea And Vomiting   Follow-up Information    Fleet Contras, MD Follow up in 3 day(s).   Specialty:  Internal Medicine Contact information: [address] [place] [address] [phone]            The results of significant diagnostics from this hospitalization (including imaging, microbiology, ancillary and laboratory) are listed below for reference.    Significant Diagnostic Studies: No results found.  Microbiology: Recent Results (from the past 240 hour(s))  MRSA PCR Screening     Status: None   Collection Time: 03/30/17 11:29 PM  Result Value Ref Range Status   MRSA by PCR NEGATIVE NEGATIVE Final    Comment:        The GeneXpert MRSA Assay (FDA approved for NASAL specimens only), is one component of a comprehensive MRSA colonization surveillance program. It is not intended to diagnose MRSA infection nor to guide or monitor treatment for MRSA infections.      Labs: Basic Metabolic Panel:  Recent Labs Lab 04/01/17 1123 04/02/17 0050 04/02/17 0603 04/03/17 0332 04/04/17 0350  NA 143 138 141 141 141  K 2.5* 5.0 5.4* 4.4  4.0  CL 125* 116* 119* 116* 114*  CO2 15* 19* 19* 21* 22  GLUCOSE 115* 128* 106* 115* 100*  BUN 5* 7 7 13 8   CREATININE 0.56 0.80 0.93 1.01* 0.92  CALCIUM 5.8* 7.9* 7.9* 7.9* 8.6*  MG 1.2*  --  2.6*  --   --    Liver Function Tests:  Recent Labs Lab 03/30/17 1630 04/02/17 0603 04/04/17 0350  AST 17 13* 15  ALT 7* 7* 8*  ALKPHOS 64 39 39  BILITOT 0.3 0.7 0.5  PROT 6.5 4.3* 4.8*  ALBUMIN 3.2* 2.4* 2.6*   No results for input(s): LIPASE, AMYLASE in the last 168 hours. No results for input(s): AMMONIA in the last 168 hours. CBC:  Recent Labs Lab 04/03/17 1654 04/04/17 0350 04/05/17 0344 04/05/17 1801 04/06/17 0311 04/06/17 0832  WBC 10.3 10.8* 9.3 11.4* 8.6  --   HGB 7.4* 7.8* 7.4* 8.2* 7.2* 7.6*  HCT 22.2* 23.5*  22.5* 25.4* 22.3* 23.9*  MCV 84.7 85.1 85.9 87.3 88.1  --   PLT 130* 155 205 236 232  --    Cardiac Enzymes: No results for input(s): CKTOTAL, CKMB, CKMBINDEX, TROPONINI in the last 168 hours. BNP: BNP (last 3 results) No results for input(s): BNP in the last 8760 hours.  ProBNP (last 3 results) No results for input(s): PROBNP in the last 8760 hours.  CBG: No results for input(s): GLUCAP in the last 168 hours.     Signed:  Carolyne Littles, MD [facility] [phone] pager

## 2017-04-23 IMAGING — DX DG HIP (WITH OR WITHOUT PELVIS) 2-3V*R*
3 series · 3 of 3 positions shown · non-contrast
Comparison: None.

CLINICAL DATA: RIGHT hip pain for 2 weeks.  No known injury.

EXAM:
DG HIP (WITH OR WITHOUT PELVIS) 2-3V RIGHT

[pelvis ap]
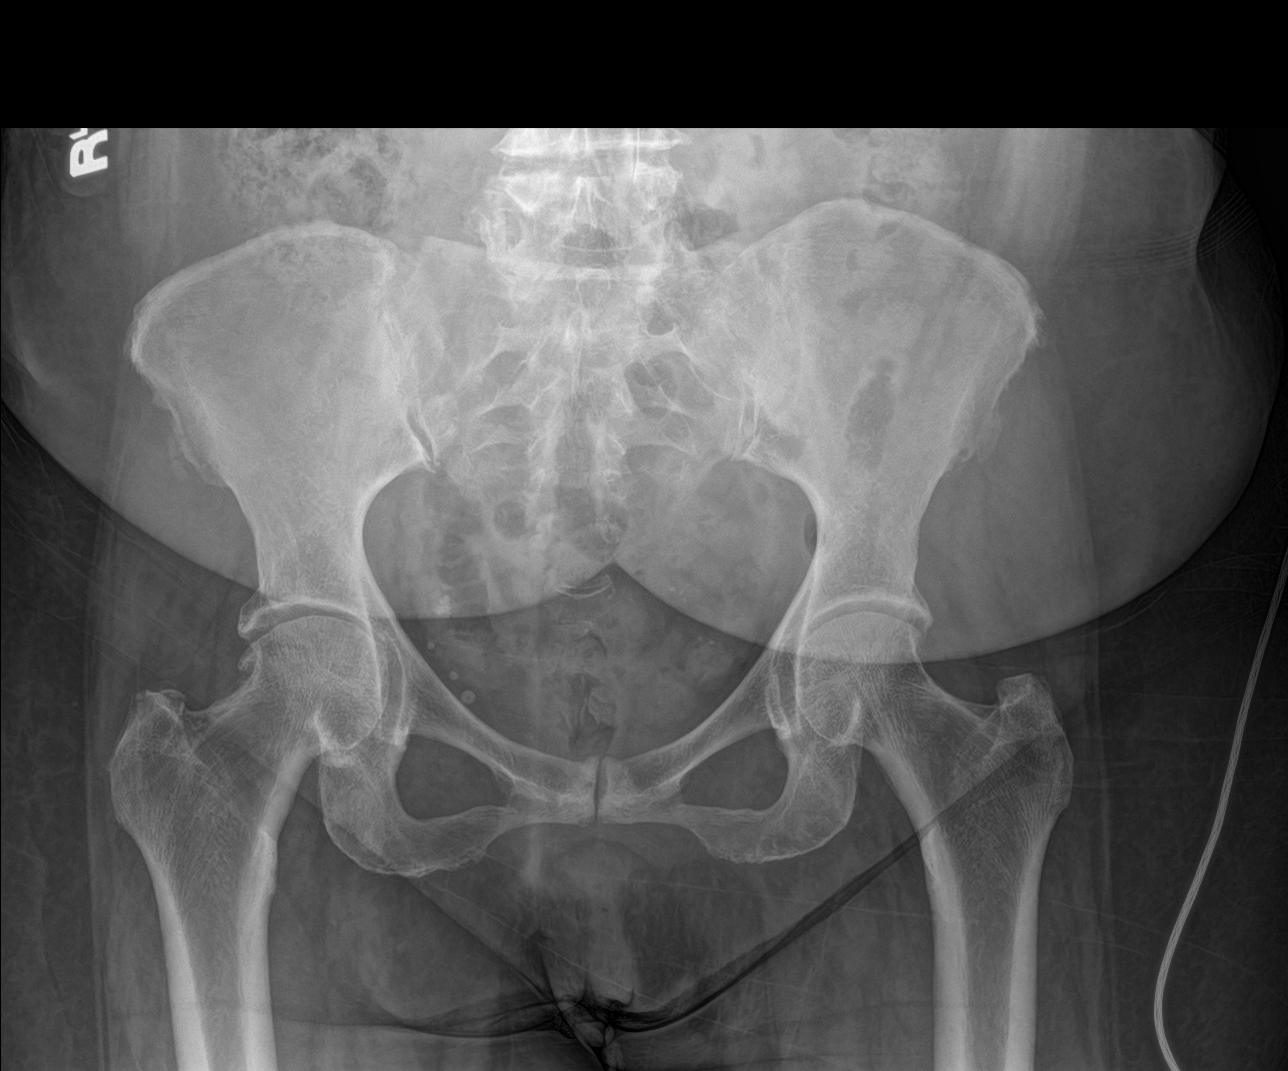

[hip ap]
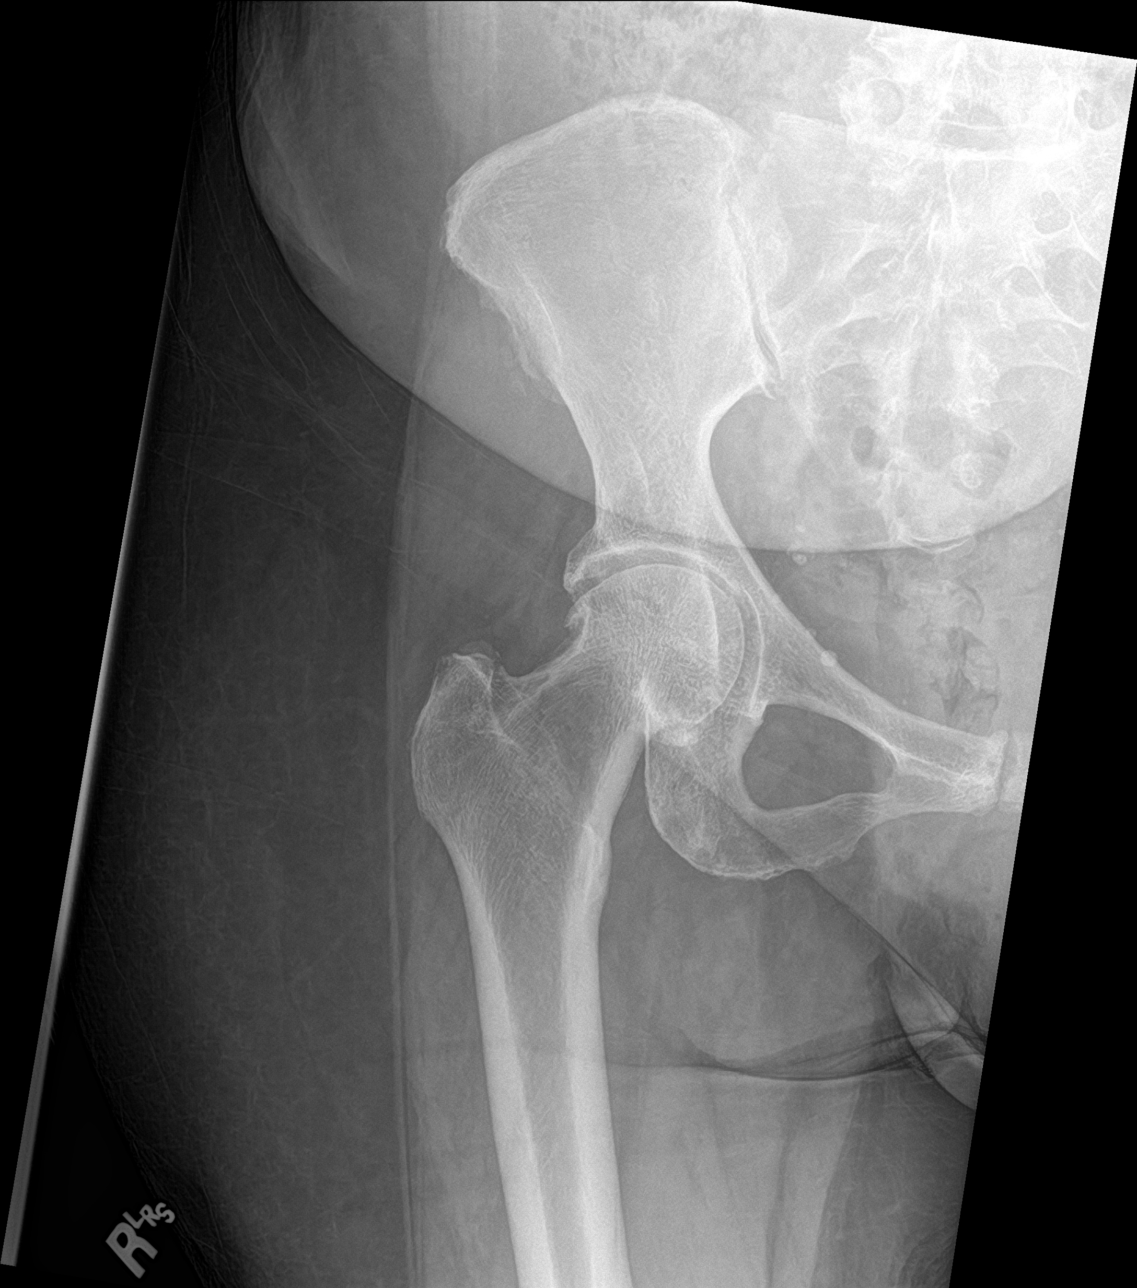

[hip lat]
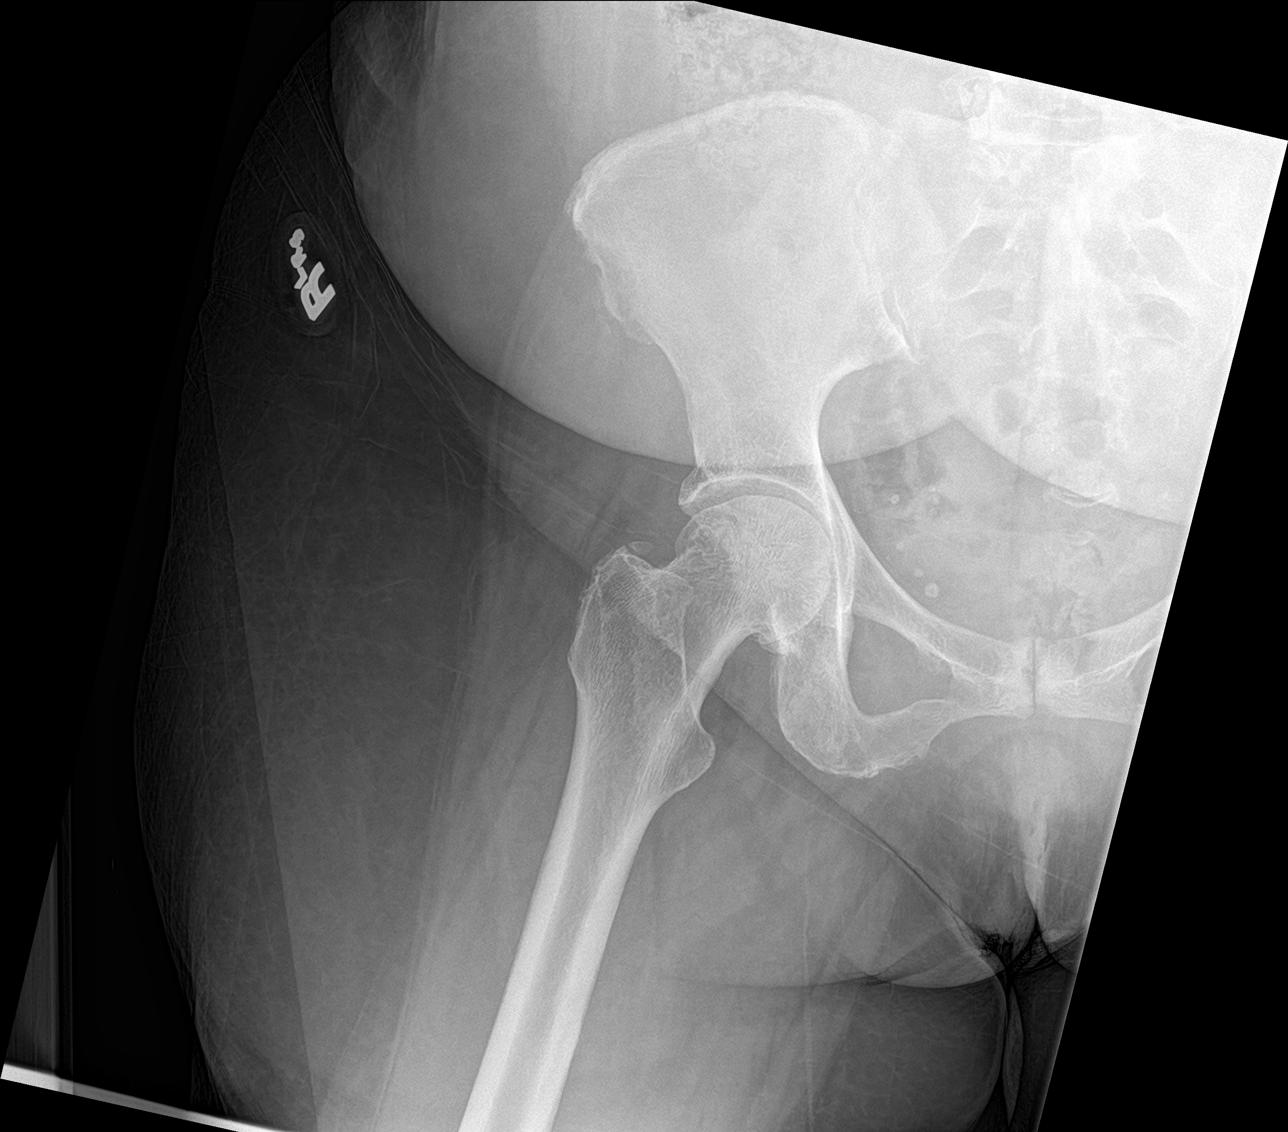

[3 of 3 positions shown; findings below may reference images not displayed]

FINDINGS: Femoral heads are well formed and located. Hip joint spaces are
intact. Moderate RIGHT hip joint space narrowing with superior
lateral acetabular spurring and femoral head neck junction spurring.
Mild LEFT fifth joint space narrowing marginal scarring. Sacroiliac
joints are symmetric.

No destructive bony lesions. Included soft tissue planes are
non-suspicious. Phleboliths in the pelvis.
IMPRESSION: Moderate RIGHT, mild LEFT hip osteoarthrosis.

No acute fracture deformity or dislocation.

## 2017-11-30 ENCOUNTER — Other Ambulatory Visit: Payer: Self-pay

## 2017-11-30 ENCOUNTER — Encounter (HOSPITAL_COMMUNITY): Payer: Self-pay | Admitting: Emergency Medicine

## 2017-11-30 ENCOUNTER — Emergency Department (HOSPITAL_COMMUNITY)
Admission: EM | Admit: 2017-11-30 | Discharge: 2017-11-30 | Disposition: A | Discharge status: Home / Self Care | Payer: Medicare Other | Attending: Emergency Medicine | Admitting: Emergency Medicine

## 2017-11-30 DIAGNOSIS — I889 Nonspecific lymphadenitis, unspecified: Secondary | ICD-10-CM

## 2017-11-30 DIAGNOSIS — Z7982 Long term (current) use of aspirin: Secondary | ICD-10-CM | POA: Insufficient documentation

## 2017-11-30 DIAGNOSIS — J029 Acute pharyngitis, unspecified: Secondary | ICD-10-CM | POA: Diagnosis not present

## 2017-11-30 DIAGNOSIS — Z79899 Other long term (current) drug therapy: Secondary | ICD-10-CM | POA: Insufficient documentation

## 2017-11-30 DIAGNOSIS — I10 Essential (primary) hypertension: Secondary | ICD-10-CM | POA: Insufficient documentation

## 2017-11-30 DIAGNOSIS — L040 Acute lymphadenitis of face, head and neck: Secondary | ICD-10-CM | POA: Diagnosis not present

## 2017-11-30 MED ORDER — DOXYCYCLINE HYCLATE 100 MG PO CAPS: 100.0000 mg | ORAL_CAPSULE | Freq: Two times a day (BID) | ORAL | 0 refills | Status: DC

## 2017-11-30 MED ORDER — TRAMADOL HCL 50 MG PO TABS: 50.0000 mg | ORAL_TABLET | Freq: Four times a day (QID) | ORAL | 0 refills | Status: DC | PRN

## 2017-11-30 NOTE — Discharge Instructions (Addendum)
Antibiotics as prescribed. Follow-up with your physician in 7 to 10 days if not resolved.

## 2017-11-30 NOTE — ED Triage Notes (Signed)
Pt. Stated, Carol Carlson had some right side neck and throat pain for 2 weeks. Its sore. Ive used warm salt water and nothing helps.

## 2017-11-30 NOTE — ED Provider Notes (Signed)
[facility] EMERGENCY DEPARTMENT Provider Note   CSN: [id number] Arrival date & time: 11/30/17  [id number]     History   Chief Complaint Chief Complaint  Patient presents with  . Neck Pain  . Sore Throat    HPI Carol Carlson is a 77 y.o. female.  Chief complaint is sore throat, and swollen lymph nodes.  HPI 77 year old female.  Had a sore throat for several days.  Her throat has improved.  Now has developed some swollen tender lymph nodes in her right neck.  Does not feel poorly in general.  No fevers.  Has poor dentition but no tooth pain.  No nausea vomiting fevers chills or other symptoms  Past Medical History:  Diagnosis Date  . Essential hypertension 08/12/2015  . GERD (gastroesophageal reflux disease) 08/12/2015  . Hypertension   . Seasonal allergies     Patient Active Problem List   Diagnosis Date Noted  . GIB (gastrointestinal bleeding) 03/30/2017  . Acute blood loss anemia 03/30/2017  . Heme positive stool 03/30/2017  . Essential hypertension 08/12/2015  . GERD (gastroesophageal reflux disease) 08/12/2015  . Acute cholecystitis 08/10/2015    Past Surgical History:  Procedure Laterality Date  . CHOLECYSTECTOMY N/A 08/11/2015   Procedure: LAPAROSCOPIC CHOLECYSTECTOMY;  Surgeon: Violeta GelinasBurke Thompson, MD;  [facility];  Service: General;  Laterality: N/A;  . COLONOSCOPY WITH PROPOFOL N/A 04/01/2017   Procedure: COLONOSCOPY WITH PROPOFOL;  Surgeon: Kathi DerBrahmbhatt, Parag, MD;  Location: [facility] ENDOSCOPY;  Service: Gastroenterology;  Laterality: N/A;     OB History   None      Home Medications    Prior to Admission medications   Medication Sig Start Date End Date Taking? Authorizing Provider  acetaminophen (TYLENOL) 325 MG tablet Take 2 tablets (650 mg total) by mouth every 6 (six) hours as needed for mild pain, moderate pain, fever or headache. 08/12/15   Sherrie GeorgeJennings, Willard, PA-C  albuterol (PROVENTIL HFA;VENTOLIN HFA) 108 (90 BASE) MCG/ACT inhaler Inhale  1-2 puffs into the lungs every 6 (six) hours as needed for wheezing or shortness of breath. 11/10/14   Marisa Severintter, Olga, MD  amLODipine-benazepril (LOTREL) 10-40 MG capsule Take 1 capsule by mouth daily.    [provider]  Aspirin-Salicylamide-Caffeine (BC FAST PAIN RELIEF) 650-195-33.3 MG PACK Take 1 Package by mouth as needed (aching).    [provider]  cetirizine (ZYRTEC) 10 MG tablet Take 10 mg by mouth daily as needed for allergies.    [provider]  Chlorpheniramine-Acetaminophen (CORICIDIN HBP COLD/FLU PO) Take by mouth. One table spoon full    [provider]  doxycycline (VIBRAMYCIN) 100 MG capsule Take 1 capsule (100 mg total) by mouth 2 (two) times daily. 11/30/17   Rolland PorterJames, Azaliah Carrero, MD  esomeprazole (NEXIUM) 20 MG capsule Take 20 mg by mouth daily as needed (acid reflux).    [provider]  traMADol (ULTRAM) 50 MG tablet Take 1 tablet (50 mg total) by mouth every 6 (six) hours as needed. 11/30/17   Rolland PorterJames, Laasya Peyton, MD    Family History No family history on file.  Social History Social History   Tobacco Use  . Smoking status: Never Smoker  . Smokeless tobacco: Never Used  Substance Use Topics  . Alcohol use: No  . Drug use: Not on file     Allergies   Aspirin and Hydrocodone-acetaminophen   Review of Systems Review of Systems  Constitutional: Negative for appetite change, chills, diaphoresis, fatigue and fever.  HENT: Positive for sore throat. Negative  for mouth sores and trouble swallowing.   Eyes: Negative for visual disturbance.  Respiratory: Negative for cough, chest tightness, shortness of breath and wheezing.   Cardiovascular: Negative for chest pain.  Gastrointestinal: Negative for abdominal distention, abdominal pain, diarrhea, nausea and vomiting.  Endocrine: Negative for polydipsia, polyphagia and polyuria.  Genitourinary: Negative for dysuria, frequency and hematuria.  Musculoskeletal: Positive for neck pain. Negative for  gait problem.  Skin: Negative for color change, pallor and rash.  Neurological: Negative for dizziness, syncope, light-headedness and headaches.  Hematological: Does not bruise/bleed easily.  Psychiatric/Behavioral: Negative for behavioral problems and confusion.     Physical Exam Updated Vital Signs BP (!) 147/70 (BP Location: Right Arm)   Pulse 81   Temp 98.1 F (36.7 C) (Oral)   Resp 17   Ht 5\' 3"  (1.6 m)   Wt 79.4 kg (175 lb)   SpO2 99%   BMI 31.00 kg/m   Physical Exam  Constitutional: She is oriented to person, place, and time. She appears well-developed and well-nourished. No distress.  HENT:  Head: Normocephalic.  Poor dentition.  No periodontal swelling.  Pharynx appears normal.  Is tender palpable lymph nodes to her right anterior chain.  Mobile.  The mouth soft and normal.  No induration or swelling  Eyes: Pupils are equal, round, and reactive to light. Conjunctivae are normal. No scleral icterus.  Neck: Normal range of motion. Neck supple. No thyromegaly present.  Cardiovascular: Normal rate and regular rhythm. Exam reveals no gallop and no friction rub.  No murmur heard. Pulmonary/Chest: Effort normal and breath sounds normal. No respiratory distress. She has no wheezes. She has no rales.  Abdominal: Soft. Bowel sounds are normal. She exhibits no distension. There is no tenderness. There is no rebound.  Musculoskeletal: Normal range of motion.  Neurological: She is alert and oriented to person, place, and time.  Skin: Skin is warm and dry. No rash noted.  Psychiatric: She has a normal mood and affect. Her behavior is normal.     ED Treatments / Results  Labs (all labs ordered are listed, but only abnormal results are displayed) Labs Reviewed - No data to display  EKG None  Radiology No results found.  Procedures Procedures (including critical care time)  Medications Ordered in ED Medications - No data to display   Initial Impression / Assessment  and Plan / ED Course  I have reviewed the triage vital signs and the nursing notes.  Pertinent labs & imaging results that were available during my care of the patient were reviewed by me and considered in my medical decision making (see chart for details).    Vocal adenitis after sore throat and elderly patient.  Lifetime non-smoker.  Placed on doxycycline for staph and strep coverage.  Tramadol for pain as she has an NSAID allergy.  Discussed with her that if her lymph node swelling does not resolve within the timeframe of completion of her antibiotics that she will need additional testing.  She expressed understanding of this.  Final Clinical Impressions(s) / ED Diagnoses   Final diagnoses:  Pharyngitis, unspecified etiology  Cervical adenitis    ED Discharge Orders        Ordered    doxycycline (VIBRAMYCIN) 100 MG capsule  2 times daily     11/30/17 0917    traMADol (ULTRAM) 50 MG tablet  Every 6 hours PRN     11/30/17 0917       Rolland Porter, MD 11/30/17 [phone]

## 2019-08-08 ENCOUNTER — Encounter (HOSPITAL_COMMUNITY): Payer: Self-pay | Admitting: *Deleted

## 2019-08-08 ENCOUNTER — Emergency Department (HOSPITAL_COMMUNITY)
Admission: EM | Admit: 2019-08-08 | Discharge: 2019-08-09 | Disposition: A | Discharge status: Home / Self Care | Payer: Medicare Other | Attending: Emergency Medicine | Admitting: Emergency Medicine

## 2019-08-08 ENCOUNTER — Other Ambulatory Visit: Payer: Self-pay

## 2019-08-08 DIAGNOSIS — G44209 Tension-type headache, unspecified, not intractable: Secondary | ICD-10-CM | POA: Insufficient documentation

## 2019-08-08 DIAGNOSIS — M62838 Other muscle spasm: Secondary | ICD-10-CM

## 2019-08-08 DIAGNOSIS — R519 Headache, unspecified: Secondary | ICD-10-CM | POA: Diagnosis present

## 2019-08-08 DIAGNOSIS — I10 Essential (primary) hypertension: Secondary | ICD-10-CM | POA: Diagnosis not present

## 2019-08-08 DIAGNOSIS — Z79899 Other long term (current) drug therapy: Secondary | ICD-10-CM | POA: Diagnosis not present

## 2019-08-08 NOTE — ED Triage Notes (Signed)
The pt is c/o a headache all day she does not usually have headaches  She takes bp medicines

## 2019-08-09 DIAGNOSIS — G44209 Tension-type headache, unspecified, not intractable: Secondary | ICD-10-CM | POA: Diagnosis not present

## 2019-08-09 MED ORDER — ALBUTEROL SULFATE HFA 108 (90 BASE) MCG/ACT IN AERS
2.0000 | INHALATION_SPRAY | Freq: Once | RESPIRATORY_TRACT | Status: AC
  Administered 2019-08-09: 2 via RESPIRATORY_TRACT
  Filled 2019-08-09: qty 6.7

## 2019-08-09 MED ORDER — KETOROLAC TROMETHAMINE 60 MG/2ML IM SOLN
15.0000 mg | Freq: Once | INTRAMUSCULAR | Status: AC
  Administered 2019-08-09: 04:00:00 15 mg via INTRAMUSCULAR
  Filled 2019-08-09: qty 2

## 2019-08-09 NOTE — ED Notes (Signed)
Pt expressing continued pain in occipital region of skull radiating down neck muscles. Will inform MD

## 2019-08-09 NOTE — Discharge Instructions (Signed)
You may use over-the-counter Motrin (Ibuprofen), Acetaminophen (Tylenol), topical muscle creams such as SalonPas, Icy Hot, Bengay, etc. Please stretch, apply heat, and have massage therapy for additional assistance. ° °

## 2019-08-09 NOTE — ED Provider Notes (Signed)
[facility] EMERGENCY DEPARTMENT Provider Note  CSN: [id number] Arrival date & time: 08/08/19 2130  Chief Complaint(s) Headache  HPI Carol Carlson is a 79 y.o. female who presents to the emergency department with 2 days of occipital headache worse with lying down and range of motion of the neck.  Patient reports that she awoke with a headache yesterday.  Has tried taking her blood pressure medicine without relief.  Also took one Tylenol which did not provide much relief.  She reports that this is similar to prior headaches but they have not lasted this long.  She denies any fevers or chills.  No focal deficits.  No visual disturbance.  No chest pain or shortness of breath.  No nausea or vomiting.  No other physical complaints.  HPI  Past Medical History Past Medical History:  Diagnosis Date  . Essential hypertension 08/12/2015  . GERD (gastroesophageal reflux disease) 08/12/2015  . Hypertension   . Seasonal allergies    Patient Active Problem List   Diagnosis Date Noted  . GIB (gastrointestinal bleeding) 03/30/2017  . Acute blood loss anemia 03/30/2017  . Heme positive stool 03/30/2017  . Essential hypertension 08/12/2015  . GERD (gastroesophageal reflux disease) 08/12/2015  . Acute cholecystitis 08/10/2015   Home Medication(s) Prior to Admission medications   Medication Sig Start Date End Date Taking? Authorizing Provider  acetaminophen (TYLENOL) 325 MG tablet Take 2 tablets (650 mg total) by mouth every 6 (six) hours as needed for mild pain, moderate pain, fever or headache. 08/12/15   Sherrie George, PA-C  albuterol (PROVENTIL HFA;VENTOLIN HFA) 108 (90 BASE) MCG/ACT inhaler Inhale 1-2 puffs into the lungs every 6 (six) hours as needed for wheezing or shortness of breath. 11/10/14   Marisa Severin, MD  amLODipine-benazepril (LOTREL) 10-40 MG capsule Take 1 capsule by mouth daily.    [provider]  Aspirin-Salicylamide-Caffeine (BC FAST PAIN RELIEF)  650-195-33.3 MG PACK Take 1 Package by mouth as needed (aching).    [provider]  cetirizine (ZYRTEC) 10 MG tablet Take 10 mg by mouth daily as needed for allergies.    [provider]  Chlorpheniramine-Acetaminophen (CORICIDIN HBP COLD/FLU PO) Take by mouth. One table spoon full    [provider]  doxycycline (VIBRAMYCIN) 100 MG capsule Take 1 capsule (100 mg total) by mouth 2 (two) times daily. 11/30/17   Rolland Porter, MD  esomeprazole (NEXIUM) 20 MG capsule Take 20 mg by mouth daily as needed (acid reflux).    [provider]  traMADol (ULTRAM) 50 MG tablet Take 1 tablet (50 mg total) by mouth every 6 (six) hours as needed. 11/30/17   Rolland Porter, MD                                                                                                                                    Past Surgical History Past Surgical History:  Procedure Laterality Date  .  CHOLECYSTECTOMY N/A 08/11/2015   Procedure: LAPAROSCOPIC CHOLECYSTECTOMY;  Surgeon: Violeta Gelinas, MD;  Location: [facility] OR;  Service: General;  Laterality: N/A;  . COLONOSCOPY WITH PROPOFOL N/A 04/01/2017   Procedure: COLONOSCOPY WITH PROPOFOL;  Surgeon: Kathi Der, MD;  Location: [facility] ENDOSCOPY;  Service: Gastroenterology;  Laterality: N/A;   Family History No family history on file.  Social History Social History   Tobacco Use  . Smoking status: Never Smoker  . Smokeless tobacco: Never Used  Substance Use Topics  . Alcohol use: No  . Drug use: Not on file   Allergies Aspirin and Hydrocodone-acetaminophen  Review of Systems Review of Systems All other systems are reviewed and are negative for acute change except as noted in the HPI  Physical Exam Vital Signs  I have reviewed the triage vital signs BP (!) 154/66   Pulse 78   Temp 98.1 F (36.7 C) (Oral)   Resp 16   Ht 5\' 3"  (1.6 m)   Wt 73.9 kg   SpO2 100%   BMI 28.87 kg/m   Physical Exam Vitals reviewed.  Constitutional:       General: She is not in acute distress.    Appearance: She is well-developed. She is not diaphoretic.  HENT:     Head: Normocephalic and atraumatic.     Nose: Nose normal.  Eyes:     General: No scleral icterus.       Right eye: No discharge.        Left eye: No discharge.     Conjunctiva/sclera: Conjunctivae normal.     Pupils: Pupils are equal, round, and reactive to light.  Neck:   Cardiovascular:     Rate and Rhythm: Normal rate and regular rhythm.     Heart sounds: No murmur. No friction rub. No gallop.   Pulmonary:     Effort: Pulmonary effort is normal. No respiratory distress.     Breath sounds: Normal breath sounds. No stridor. No rales.  Abdominal:     General: There is no distension.     Palpations: Abdomen is soft.     Tenderness: There is no abdominal tenderness.  Musculoskeletal:        General: No tenderness.     Cervical back: Normal range of motion and neck supple. Muscular tenderness present. No spinous process tenderness. Normal range of motion.  Skin:    General: Skin is warm and dry.     Findings: No erythema or rash.  Neurological:     Mental Status: She is alert and oriented to person, place, and time.     ED Results and Treatments Labs (all labs ordered are listed, but only abnormal results are displayed) Labs Reviewed - No data to display                                                                                                                       EKG  EKG Interpretation  Date/Time:    Ventricular Rate:  PR Interval:    QRS Duration:   QT Interval:    QTC Calculation:   R Axis:     Text Interpretation:        Radiology No results found.  Pertinent labs & imaging results that were available during my care of the patient were reviewed by me and considered in my medical decision making (see chart for details).  Medications Ordered in ED Medications  ketorolac (TORADOL) injection 15 mg (15 mg Intramuscular Given 08/09/19 0401)    albuterol (VENTOLIN HFA) 108 (90 Base) MCG/ACT inhaler 2 puff (2 puffs Inhalation Given 08/09/19 0446)                                                                                                                                    Procedures Procedures  (including critical care time)  Medical Decision Making / ED Course I have reviewed the nursing notes for this encounter and the patient's prior records (if available in EHR or on provided paperwork).   Carol Carlson was evaluated in Emergency Department on 08/09/2019 for the symptoms described in the history of present illness. She was evaluated in the context of the global COVID-19 pandemic, which necessitated consideration that the patient might be at risk for infection with the SARS-CoV-2 virus that causes COVID-19. Institutional protocols and algorithms that pertain to the evaluation of patients at risk for COVID-19 are in a state of rapid change based on information released by regulatory bodies including the CDC and federal and state organizations. These policies and algorithms were followed during the patient's care in the ED.  Presentation is consistent with muscle strain/spasm of the left upper back/shoulder and neck region.  Reproduced with palpation.  No nuchal rigidity or fever concerning for meningitis.  Doubt dissection.  No focal deficits.  Doubt subarachnoid or intracranial hemorrhage. Improved with toradol.      Final Clinical Impression(s) / ED Diagnoses Final diagnoses:  Muscle spasm  Muscle tension headache   The patient appears reasonably screened and/or stabilized for discharge and I doubt any other medical condition or other [facility] requiring further screening, evaluation, or treatment in the ED at this time prior to discharge. Safe for discharge with strict return precautions.  Disposition: Discharge  Condition: Good  I have discussed the results, Dx and Tx plan with the patient/family who expressed understanding  and agree(s) with the plan. Discharge instructions discussed at length. The patient/family was given strict return precautions who verbalized understanding of the instructions. No further questions at time of discharge.    ED Discharge Orders    None        Follow Up: Primary care provider  Schedule an appointment as soon as possible for a visit        This chart was dictated using voice recognition software.  Despite best efforts to proofread,  errors can occur which can change the documentation meaning.   [place],  Grayce Sessions, MD 08/09/19 [phone]

## 2019-10-25 ENCOUNTER — Encounter (HOSPITAL_COMMUNITY): Payer: Self-pay | Admitting: Emergency Medicine

## 2019-10-25 ENCOUNTER — Other Ambulatory Visit: Payer: Self-pay

## 2019-10-25 ENCOUNTER — Emergency Department (HOSPITAL_COMMUNITY)
Admission: EM | Admit: 2019-10-25 | Discharge: 2019-10-26 | Disposition: A | Discharge status: Home / Self Care | Payer: Medicare Other | Attending: Emergency Medicine | Admitting: Emergency Medicine

## 2019-10-25 DIAGNOSIS — M436 Torticollis: Secondary | ICD-10-CM | POA: Insufficient documentation

## 2019-10-25 DIAGNOSIS — I10 Essential (primary) hypertension: Secondary | ICD-10-CM | POA: Diagnosis not present

## 2019-10-25 DIAGNOSIS — Z79899 Other long term (current) drug therapy: Secondary | ICD-10-CM | POA: Diagnosis not present

## 2019-10-25 DIAGNOSIS — M542 Cervicalgia: Secondary | ICD-10-CM | POA: Diagnosis present

## 2019-10-25 NOTE — ED Triage Notes (Signed)
Pt c/o posterior head pain that radiates to her left shoulder. Denies shortness of breath/chest pain, denies injury. States pain started when she woke up this morning.

## 2019-10-26 ENCOUNTER — Emergency Department (HOSPITAL_COMMUNITY): Payer: Medicare Other

## 2019-10-26 DIAGNOSIS — M436 Torticollis: Secondary | ICD-10-CM | POA: Diagnosis not present

## 2019-10-26 MED ORDER — KETOROLAC TROMETHAMINE 15 MG/ML IJ SOLN
15.0000 mg | Freq: Once | INTRAMUSCULAR | Status: AC
  Administered 2019-10-26: 15 mg via INTRAMUSCULAR
  Filled 2019-10-26: qty 1

## 2019-10-26 MED ORDER — PROCHLORPERAZINE EDISYLATE 10 MG/2ML IJ SOLN
10.0000 mg | Freq: Once | INTRAMUSCULAR | Status: AC
  Administered 2019-10-26: 10 mg via INTRAVENOUS
  Filled 2019-10-26: qty 2

## 2019-10-26 MED ORDER — KETOROLAC TROMETHAMINE 15 MG/ML IJ SOLN: 15.0000 mg | Freq: Once | INTRAMUSCULAR | Status: DC

## 2019-10-26 MED ORDER — DIPHENHYDRAMINE HCL 50 MG/ML IJ SOLN: 25.0000 mg | Freq: Once | INTRAMUSCULAR | Status: DC

## 2019-10-26 MED ORDER — SODIUM CHLORIDE 0.9 % IV BOLUS: 1000.0000 mL | Freq: Once | INTRAVENOUS | Status: DC

## 2019-10-26 MED ORDER — TIZANIDINE HCL 2 MG PO TABS: 2.0000 mg | ORAL_TABLET | Freq: Four times a day (QID) | ORAL | 0 refills | Status: AC | PRN

## 2019-10-26 MED ORDER — DIPHENHYDRAMINE HCL 50 MG/ML IJ SOLN
25.0000 mg | Freq: Once | INTRAMUSCULAR | Status: AC
  Administered 2019-10-26: 06:00:00 25 mg via INTRAMUSCULAR
  Filled 2019-10-26: qty 1

## 2019-10-26 NOTE — ED Provider Notes (Signed)
[facility] EMERGENCY DEPARTMENT Provider Note   CSN: [id number] Arrival date & time: 10/25/19  2235   History Chief Complaint  Patient presents with  . Neck Pain    Carol Carlson is a 79 y.o. female.  The history is provided by the patient.  Neck Pain She has history of hypertension and comes in complaining of pain in the left side of her neck radiating up to the left occiput and into the left shoulder.  Pain started yesterday morning and is constant.  Nothing makes it better, nothing makes it worse.  There is no associated numbness or tingling.  She took [facility] powder without any relief.  She did have a similar episode several months ago.  She does not recall any trauma or unusual bending or lifting or twisting.  Past Medical History:  Diagnosis Date  . Essential hypertension 08/12/2015  . GERD (gastroesophageal reflux disease) 08/12/2015  . Hypertension   . Seasonal allergies     Patient Active Problem List   Diagnosis Date Noted  . GIB (gastrointestinal bleeding) 03/30/2017  . Acute blood loss anemia 03/30/2017  . Heme positive stool 03/30/2017  . Essential hypertension 08/12/2015  . GERD (gastroesophageal reflux disease) 08/12/2015  . Acute cholecystitis 08/10/2015    Past Surgical History:  Procedure Laterality Date  . CHOLECYSTECTOMY N/A 08/11/2015   Procedure: LAPAROSCOPIC CHOLECYSTECTOMY;  Surgeon: Violeta Gelinas, MD;  Location: [facility] OR;  Service: General;  Laterality: N/A;  . COLONOSCOPY WITH PROPOFOL N/A 04/01/2017   Procedure: COLONOSCOPY WITH PROPOFOL;  Surgeon: Kathi Der, MD;  Location: [facility] ENDOSCOPY;  Service: Gastroenterology;  Laterality: N/A;     OB History   No obstetric history on file.     No family history on file.  Social History   Tobacco Use  . Smoking status: Never Smoker  . Smokeless tobacco: Never Used  Substance Use Topics  . Alcohol use: No  . Drug use: Not on file    Home Medications Prior to Admission  medications   Medication Sig Start Date End Date Taking? Authorizing Provider  acetaminophen (TYLENOL) 325 MG tablet Take 2 tablets (650 mg total) by mouth every 6 (six) hours as needed for mild pain, moderate pain, fever or headache. 08/12/15  Yes Sherrie George, PA-C  albuterol (PROVENTIL HFA;VENTOLIN HFA) 108 (90 BASE) MCG/ACT inhaler Inhale 1-2 puffs into the lungs every 6 (six) hours as needed for wheezing or shortness of breath. 11/10/14  Yes Marisa Severin, MD  amLODipine-benazepril (LOTREL) 10-40 MG capsule Take 1 capsule by mouth daily.   Yes [provider]  Aspirin-Salicylamide-Caffeine (BC FAST PAIN RELIEF) 650-195-33.3 MG PACK Take 1 Package by mouth as needed (aching).   Yes [provider]  cetirizine (ZYRTEC) 10 MG tablet Take 10 mg by mouth daily as needed for allergies.   Yes [provider]  Chlorpheniramine-Acetaminophen (CORICIDIN HBP COLD/FLU PO) Take 5 mLs by mouth 2 (two) times daily as needed (Cough and congestion).    Yes [provider]  esomeprazole (NEXIUM) 20 MG capsule Take 20 mg by mouth daily as needed (acid reflux).   Yes [provider]  fluticasone (FLONASE) 50 MCG/ACT nasal spray Place 2 sprays into both nostrils daily as needed for allergies or rhinitis.   Yes [provider]  doxycycline (VIBRAMYCIN) 100 MG capsule Take 1 capsule (100 mg total) by mouth 2 (two) times daily. Patient not taking: Reported on 10/26/2019 11/30/17   Rolland Porter, MD  traMADol Janean Sark) 50 MG tablet Take  1 tablet (50 mg total) by mouth every 6 (six) hours as needed. Patient not taking: Reported on 10/26/2019 11/30/17   Rolland Porter, MD    Allergies    Aspirin and Hydrocodone-acetaminophen  Review of Systems   Review of Systems  Musculoskeletal: Positive for neck pain.  All other systems reviewed and are negative.   Physical Exam Updated Vital Signs BP 130/63   Pulse 79   Temp 98.5 F (36.9 C)   Resp 15   SpO2 100%   Physical  Exam Vitals and nursing note reviewed.   79 year old female, resting comfortably and in no acute distress. Vital signs are normal. Oxygen saturation is 100%, which is normal. Head is normocephalic and atraumatic. PERRLA, EOMI. Oropharynx is clear. Neck: There is spasm and tenderness at the left sternocleidomastoid muscle and the left paracervical muscles.  There is no midline tenderness.  Pain is elicited with passive range of motion. Back is nontender and there is no CVA tenderness. Lungs are clear without rales, wheezes, or rhonchi. Chest is nontender. Heart has regular rate and rhythm without murmur. Abdomen is soft, flat, nontender without masses or hepatosplenomegaly and peristalsis is normoactive. Extremities have no cyanosis or edema, full range of motion is present. Skin is warm and dry without rash. Neurologic: Mental status is normal, cranial nerves are intact, there are no motor or sensory deficits.  ED Results / Procedures / Treatments    Radiology CT Cervical Spine Wo Contrast  Result Date: 10/26/2019 CLINICAL DATA:  Neck pain, known malignancy EXAM: CT CERVICAL SPINE WITHOUT CONTRAST TECHNIQUE: Multidetector CT imaging of the cervical spine was performed without intravenous contrast. Multiplanar CT image reconstructions were also generated. COMPARISON:  Cervical spine radiographs 10/14/2007 FINDINGS: Alignment: Reversal of the normal cervical lordosis. Craniocervical atlantoaxial articulations are in normal alignment. No abnormally widened, jumped or perched facets. Skull base and vertebrae: No acute fracture. The sclerotic changes at the C4-C5 level are favored to be degenerative in nature given a similar though less pronounced appearance on radiograph comparison from 2009. Few foci of likely nitrogenous gas involving the superior endplates C5 and C7. Soft tissues and spinal canal: No pre or paravertebral fluid or swelling. No visible canal hematoma. Disc levels: Multilevel  intervertebral disc height loss with spondylitic endplate changes. Disc osteophyte complexes are present C3-C7 with at most mild canal narrowing at C4-5 and C5-6. Uncinate spurring and facet arthropathy are present throughout the cervical spine is well with some moderate neural foraminal narrowing eccentric to the left at C2-3 and C3-4. More mild narrowing on the left at C5-6 and C6-7 as well. Upper chest: No acute abnormality in the upper chest or imaged lung apices. Other: No concerning thyroid nodules. Coarse vascular calcifications in the cervical carotids. IMPRESSION: 1. No acute fracture or malalignment of the cervical spine. 2. The sclerotic changes at the C4-C5 level are favored to be degenerative in nature given a similar though less pronounced appearance on comparison radiograph from 2009. 3. Multilevel degenerative disc disease and facet arthropathy with multilevel neural foraminal narrowing eccentric to the left at C2-3 and C3-4. Electronically Signed   By: Kreg Shropshire M.D.   On: 10/26/2019 06:05    Procedures Procedures   Medications Ordered in ED Medications  prochlorperazine (COMPAZINE) injection 10 mg (10 mg Intravenous Given 10/26/19 0608)  ketorolac (TORADOL) 15 MG/ML injection 15 mg (15 mg Intramuscular Given 10/26/19 0607)  diphenhydrAMINE (BENADRYL) injection 25 mg (25 mg Intramuscular Given 10/26/19 0606)    ED Course  I have reviewed the triage vital signs and the nursing notes.  Pertinent imaging results that were available during my care of the patient were reviewed by me and considered in my medical decision making (see chart for details).  MDM Rules/Calculators/A&P Neck pain radiating to occiput and shoulder but no evidence of any neurologic injury.  Will send for CT of cervical spine to further evaluate.  Old records are reviewed, and she did have a similar presentation on February 17.  We will also treat as if this were a muscle contraction headache she is given a dose of  prochlorperazine and diphenhydramine.  CT scan shows degenerative changes but no other acute process.  She is given an dose of prochlorperazine and ketorolac with excellent relief of symptoms.  She is discharged with prescription for tizanidine and told to use over-the-counter acetaminophen and NSAIDs as needed.  Recommended she apply ice as needed.  Follow-up with PCP.  Return precautions discussed.  Final Clinical Impression(s) / ED Diagnoses Final diagnoses:  Torticollis, acquired    Rx / DC Orders ED Discharge Orders         Ordered    tiZANidine (ZANAFLEX) 2 MG tablet  Every 6 hours PRN     10/26/19 4696           Delora Fuel, MD 29[birth year]/84 [phone]

## 2019-10-26 NOTE — Discharge Instructions (Addendum)
Apply ice as needed.  Take acetaminophen and/or ibuprofen as needed for pain.  Return if you are having any problems.

## 2019-10-26 NOTE — ED Notes (Signed)
Pt complains of pain from neck radiating down to back.

## 2019-10-26 NOTE — ED Notes (Signed)
Patient given discharge instructions. Questions were answered. Patient verbalized understanding of discharge instructions and care at home.  

## 2021-02-08 IMAGING — CT CT CERVICAL SPINE W/O CM
3 of 4 series · 13 of 33 positions shown, 16 images · non-contrast
Comparison: Cervical spine radiographs 10/14/2007

CLINICAL DATA: Neck pain, known malignancy

EXAM:
CT CERVICAL SPINE WITHOUT CONTRAST
TECHNIQUE: Multidetector CT imaging of the cervical spine was performed without
intravenous contrast. Multiplanar CT image reconstructions were also
generated.

[Series 8: sag bone · sagittal · 0.40mm/px · 5 of 106 slices shown, 6 images]
[im 36/106  bone]
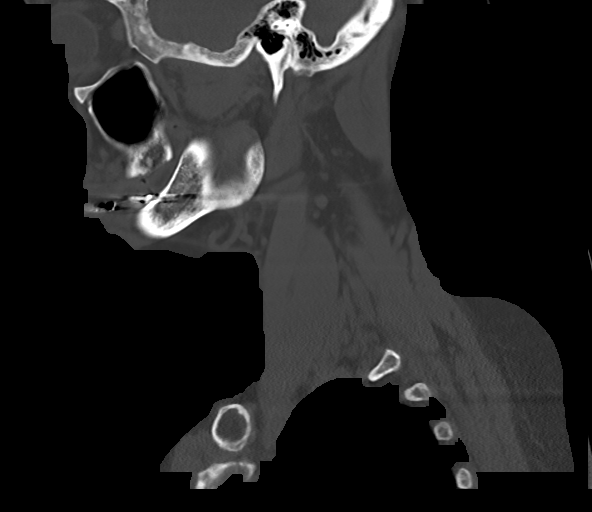
[im 44/106  bone]
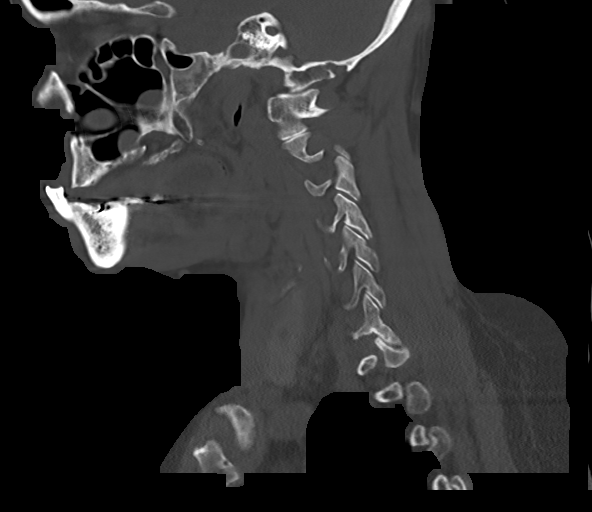
[im 53/106  soft-tissue]
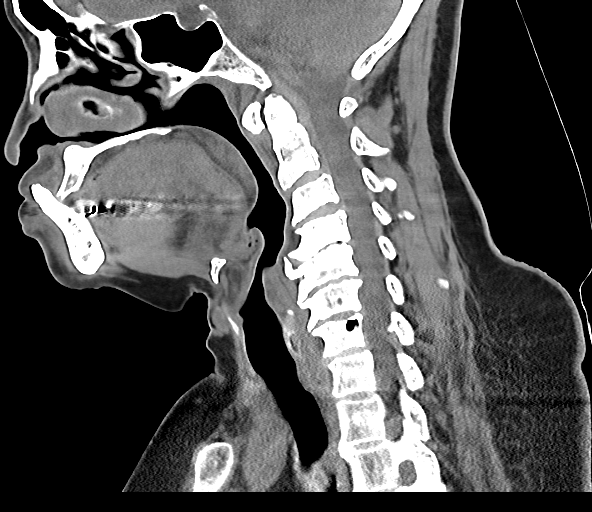
[im 53/106  bone]
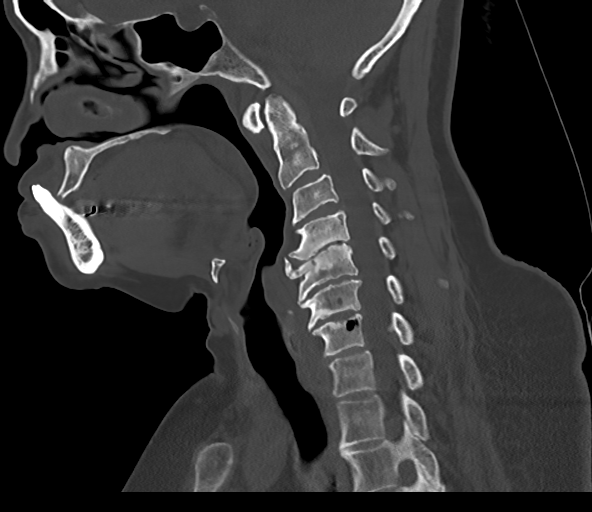
[im 62/106  bone]
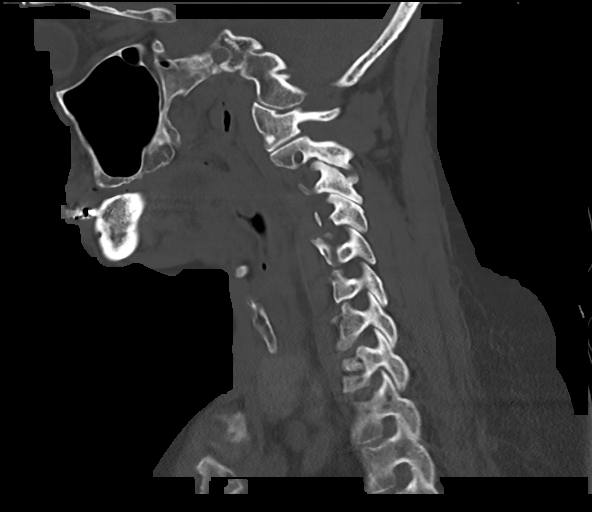
[im 71/106  bone]
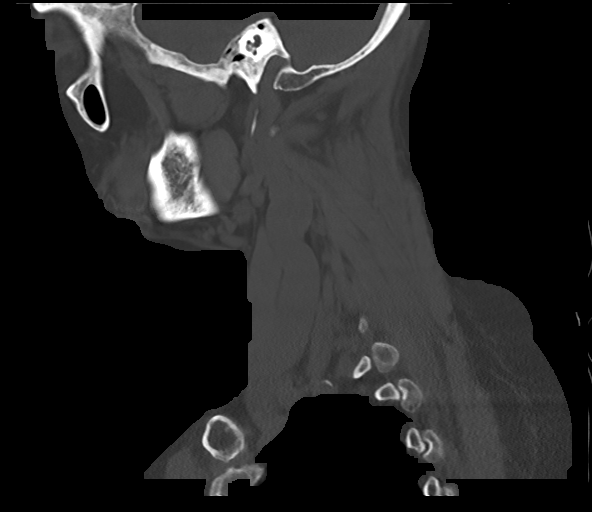

[Series 9: cor bone · coronal · 0.40mm/px · 3 of 97 slices shown]
[im 20/97  bone]
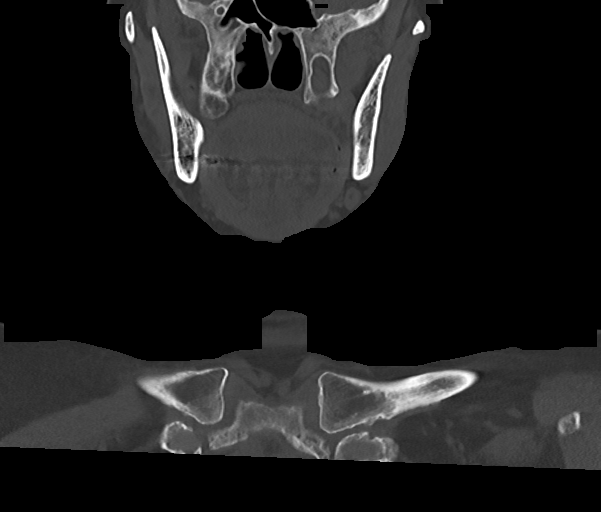
[im 39/97  bone]
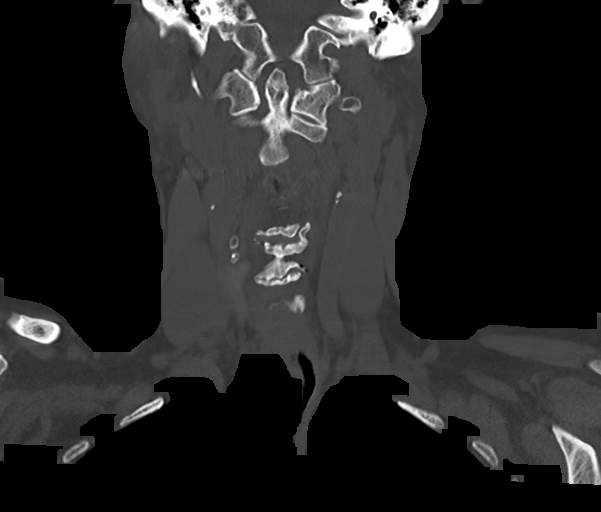
[im 58/97  bone]
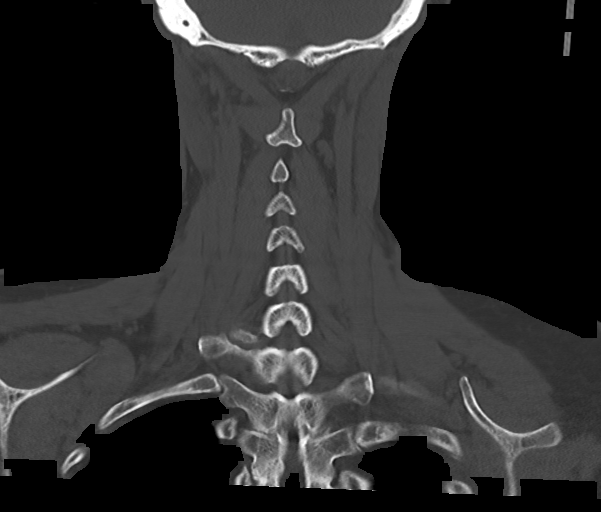

[Series 10: orthogonal axials · axial · 0.21mm/px · z∈[-260,-149]mm · 5 of 95 slices shown, 7 images]
[im 16/95  soft-tissue]
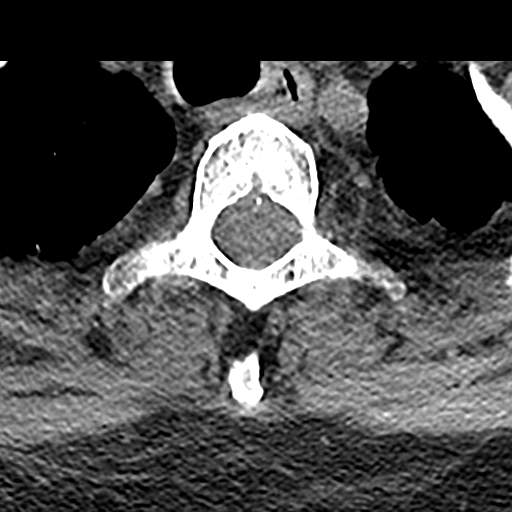
[im 16/95  bone]
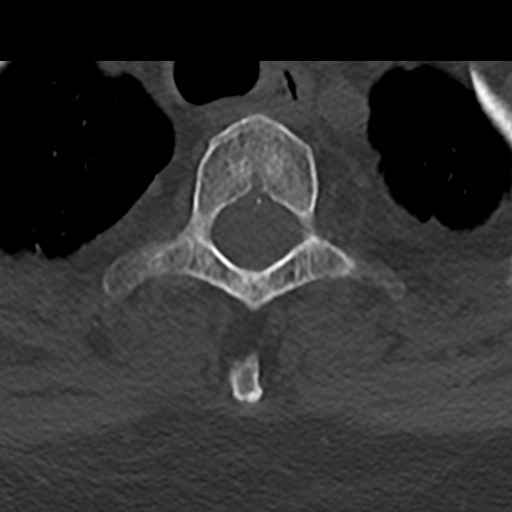
[im 32/95  bone]
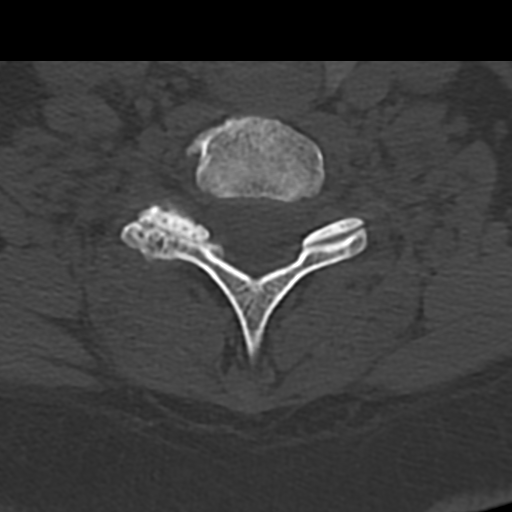
[im 48/95  bone]
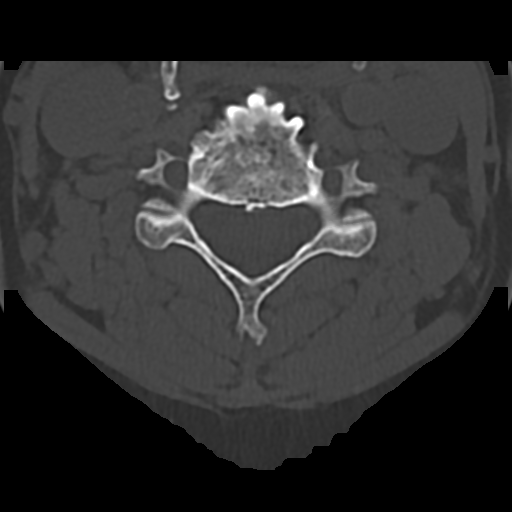
[im 63/95  bone]
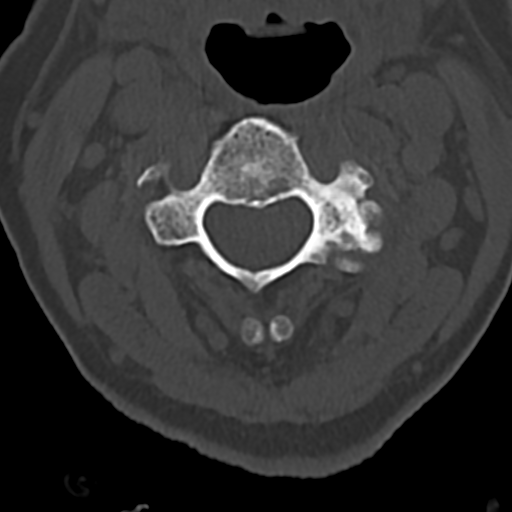
[im 79/95  soft-tissue]
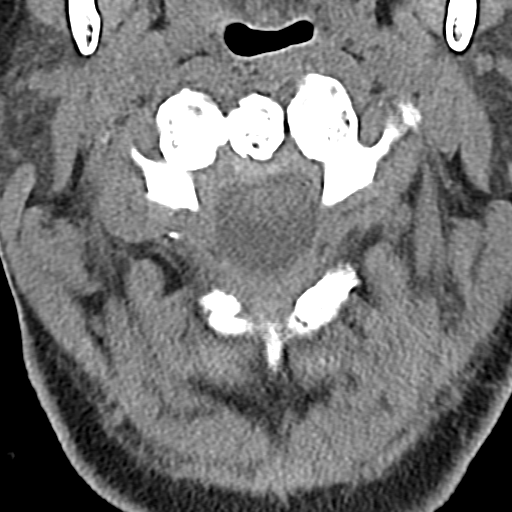
[im 79/95  bone]
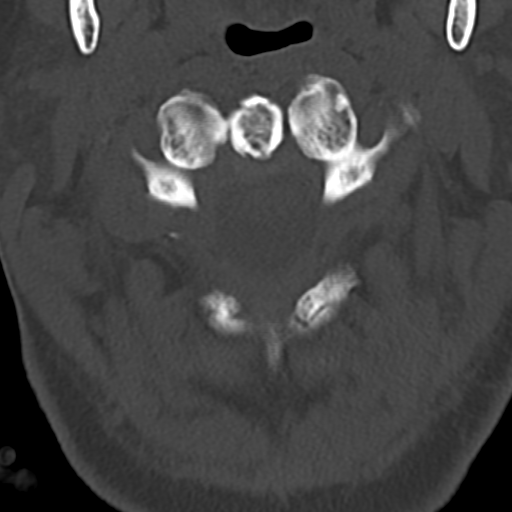

[13 of 33 positions shown; findings below may reference images not displayed]

FINDINGS: Alignment: Reversal of the normal cervical lordosis. Craniocervical
atlantoaxial articulations are in normal alignment. No abnormally
widened, jumped or perched facets.

Skull base and vertebrae: No acute fracture. The sclerotic changes
at the C4-C5 level are favored to be degenerative in nature given a
similar though less pronounced appearance on radiograph comparison
from 5118. Few foci of likely nitrogenous gas involving the superior
endplates C5 and C7.

Soft tissues and spinal canal: No pre or paravertebral fluid or
swelling. No visible canal hematoma.

Disc levels: Multilevel intervertebral disc height loss with
spondylitic endplate changes. Disc osteophyte complexes are present
C3-C7 with at most mild canal narrowing at C4-5 and C5-6. Uncinate
spurring and facet arthropathy are present throughout the cervical
spine is well with some moderate neural foraminal narrowing
eccentric to the left at C2-3 and C3-4. More mild narrowing on the
left at C5-6 and C6-7 as well.

Upper chest: No acute abnormality in the upper chest or imaged lung
apices.

Other: No concerning thyroid nodules. Coarse vascular calcifications
in the cervical carotids.
IMPRESSION: 1. No acute fracture or malalignment of the cervical spine.
2. The sclerotic changes at the C4-C5 level are favored to be
degenerative in nature given a similar though less pronounced
appearance on comparison radiograph from 5118.
3. Multilevel degenerative disc disease and facet arthropathy with
multilevel neural foraminal narrowing eccentric to the left at C2-3
and C3-4.
